# Patient Record
Sex: Male | Born: 1960 | Race: White | Hispanic: No | Marital: Married | Smoking: Never smoker
Health system: Southern US, Community
[De-identification: ages and names within clinical notes are randomized; demographics above are authoritative.]

## PROBLEM LIST (undated history)

## (undated) DIAGNOSIS — I10 Essential (primary) hypertension: Secondary | ICD-10-CM

## (undated) DIAGNOSIS — E785 Hyperlipidemia, unspecified: Secondary | ICD-10-CM

## (undated) DIAGNOSIS — R200 Anesthesia of skin: Secondary | ICD-10-CM

## (undated) DIAGNOSIS — R3915 Urgency of urination: Secondary | ICD-10-CM

## (undated) DIAGNOSIS — R300 Dysuria: Secondary | ICD-10-CM

## (undated) DIAGNOSIS — M25519 Pain in unspecified shoulder: Secondary | ICD-10-CM

## (undated) DIAGNOSIS — M25529 Pain in unspecified elbow: Secondary | ICD-10-CM

## (undated) DIAGNOSIS — R519 Headache, unspecified: Secondary | ICD-10-CM

## (undated) DIAGNOSIS — R351 Nocturia: Secondary | ICD-10-CM

## (undated) DIAGNOSIS — R6882 Decreased libido: Secondary | ICD-10-CM

## (undated) DIAGNOSIS — M1990 Unspecified osteoarthritis, unspecified site: Secondary | ICD-10-CM

## (undated) DIAGNOSIS — K649 Unspecified hemorrhoids: Secondary | ICD-10-CM

## (undated) DIAGNOSIS — H9319 Tinnitus, unspecified ear: Secondary | ICD-10-CM

## (undated) DIAGNOSIS — G4730 Sleep apnea, unspecified: Secondary | ICD-10-CM

## (undated) HISTORY — DX: Hyperlipidemia, unspecified: E78.5

## (undated) HISTORY — DX: Dysuria: R30.0

## (undated) HISTORY — DX: Pain in unspecified elbow: M25.529

## (undated) HISTORY — DX: Unspecified osteoarthritis, unspecified site: M19.90

## (undated) HISTORY — PX: COLONOSCOPY: SHX174

## (undated) HISTORY — DX: Pain in unspecified shoulder: M25.519

## (undated) HISTORY — PX: VASECTOMY: SHX75

## (undated) HISTORY — DX: Sleep apnea, unspecified: G47.30

## (undated) HISTORY — PX: KNEE ARTHROTOMY: SUR107

## (undated) HISTORY — DX: Headache, unspecified: R51.9

## (undated) HISTORY — DX: Unspecified hemorrhoids: K64.9

## (undated) HISTORY — DX: Urgency of urination: R39.15

## (undated) HISTORY — DX: Nocturia: R35.1

## (undated) HISTORY — DX: Decreased libido: R68.82

## (undated) HISTORY — DX: Anesthesia of skin: R20.0

## (undated) HISTORY — DX: Tinnitus, unspecified ear: H93.19

---
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
## 2017-11-17 DIAGNOSIS — J40 Bronchitis, not specified as acute or chronic: Secondary | ICD-10-CM | POA: Diagnosis not present

## 2017-11-17 DIAGNOSIS — J0100 Acute maxillary sinusitis, unspecified: Secondary | ICD-10-CM | POA: Diagnosis not present

## 2018-08-02 DIAGNOSIS — Z0000 Encounter for general adult medical examination without abnormal findings: Secondary | ICD-10-CM | POA: Diagnosis not present

## 2018-08-20 DIAGNOSIS — M7502 Adhesive capsulitis of left shoulder: Secondary | ICD-10-CM | POA: Diagnosis not present

## 2018-08-20 DIAGNOSIS — M19022 Primary osteoarthritis, left elbow: Secondary | ICD-10-CM | POA: Diagnosis not present

## 2018-08-20 DIAGNOSIS — Z0000 Encounter for general adult medical examination without abnormal findings: Secondary | ICD-10-CM | POA: Diagnosis not present

## 2018-08-20 DIAGNOSIS — R7303 Prediabetes: Secondary | ICD-10-CM | POA: Diagnosis not present

## 2018-09-02 DIAGNOSIS — M25712 Osteophyte, left shoulder: Secondary | ICD-10-CM | POA: Diagnosis not present

## 2018-09-02 DIAGNOSIS — M25722 Osteophyte, left elbow: Secondary | ICD-10-CM | POA: Diagnosis not present

## 2018-09-02 DIAGNOSIS — M19012 Primary osteoarthritis, left shoulder: Secondary | ICD-10-CM | POA: Diagnosis not present

## 2018-09-02 DIAGNOSIS — M19022 Primary osteoarthritis, left elbow: Secondary | ICD-10-CM | POA: Diagnosis not present

## 2018-12-05 DIAGNOSIS — J22 Unspecified acute lower respiratory infection: Secondary | ICD-10-CM | POA: Diagnosis not present

## 2019-08-11 DIAGNOSIS — R399 Unspecified symptoms and signs involving the genitourinary system: Secondary | ICD-10-CM | POA: Diagnosis not present

## 2019-08-11 DIAGNOSIS — R030 Elevated blood-pressure reading, without diagnosis of hypertension: Secondary | ICD-10-CM | POA: Diagnosis not present

## 2019-08-11 DIAGNOSIS — Z800 Family history of malignant neoplasm of digestive organs: Secondary | ICD-10-CM | POA: Diagnosis not present

## 2019-08-11 DIAGNOSIS — E663 Overweight: Secondary | ICD-10-CM | POA: Diagnosis not present

## 2019-08-11 DIAGNOSIS — Z1331 Encounter for screening for depression: Secondary | ICD-10-CM | POA: Diagnosis not present

## 2019-08-22 DIAGNOSIS — L821 Other seborrheic keratosis: Secondary | ICD-10-CM | POA: Diagnosis not present

## 2019-08-22 DIAGNOSIS — L570 Actinic keratosis: Secondary | ICD-10-CM | POA: Diagnosis not present

## 2019-08-29 DIAGNOSIS — Z0000 Encounter for general adult medical examination without abnormal findings: Secondary | ICD-10-CM | POA: Diagnosis not present

## 2019-08-29 DIAGNOSIS — R7989 Other specified abnormal findings of blood chemistry: Secondary | ICD-10-CM | POA: Diagnosis not present

## 2019-08-29 DIAGNOSIS — Z125 Encounter for screening for malignant neoplasm of prostate: Secondary | ICD-10-CM | POA: Diagnosis not present

## 2019-08-30 DIAGNOSIS — R82998 Other abnormal findings in urine: Secondary | ICD-10-CM | POA: Diagnosis not present

## 2019-09-01 DIAGNOSIS — Z0000 Encounter for general adult medical examination without abnormal findings: Secondary | ICD-10-CM | POA: Diagnosis not present

## 2019-09-07 DIAGNOSIS — R350 Frequency of micturition: Secondary | ICD-10-CM | POA: Diagnosis not present

## 2019-09-07 DIAGNOSIS — R351 Nocturia: Secondary | ICD-10-CM | POA: Diagnosis not present

## 2019-09-07 DIAGNOSIS — N401 Enlarged prostate with lower urinary tract symptoms: Secondary | ICD-10-CM | POA: Diagnosis not present

## 2019-09-07 DIAGNOSIS — R3912 Poor urinary stream: Secondary | ICD-10-CM | POA: Diagnosis not present

## 2019-09-08 DIAGNOSIS — Q849 Congenital malformation of integument, unspecified: Secondary | ICD-10-CM | POA: Diagnosis not present

## 2019-09-08 DIAGNOSIS — R399 Unspecified symptoms and signs involving the genitourinary system: Secondary | ICD-10-CM | POA: Diagnosis not present

## 2019-09-08 DIAGNOSIS — Z0000 Encounter for general adult medical examination without abnormal findings: Secondary | ICD-10-CM | POA: Diagnosis not present

## 2019-09-08 DIAGNOSIS — R51 Headache: Secondary | ICD-10-CM | POA: Diagnosis not present

## 2019-09-08 DIAGNOSIS — Z800 Family history of malignant neoplasm of digestive organs: Secondary | ICD-10-CM | POA: Diagnosis not present

## 2019-09-14 DIAGNOSIS — Z1212 Encounter for screening for malignant neoplasm of rectum: Secondary | ICD-10-CM | POA: Diagnosis not present

## 2019-10-28 DIAGNOSIS — N401 Enlarged prostate with lower urinary tract symptoms: Secondary | ICD-10-CM | POA: Diagnosis not present

## 2019-10-28 DIAGNOSIS — R351 Nocturia: Secondary | ICD-10-CM | POA: Diagnosis not present

## 2019-10-28 DIAGNOSIS — R3916 Straining to void: Secondary | ICD-10-CM | POA: Diagnosis not present

## 2019-10-28 DIAGNOSIS — R3914 Feeling of incomplete bladder emptying: Secondary | ICD-10-CM | POA: Diagnosis not present

## 2019-11-04 DIAGNOSIS — E7849 Other hyperlipidemia: Secondary | ICD-10-CM | POA: Diagnosis not present

## 2019-11-08 DIAGNOSIS — R399 Unspecified symptoms and signs involving the genitourinary system: Secondary | ICD-10-CM | POA: Diagnosis not present

## 2019-11-08 DIAGNOSIS — R030 Elevated blood-pressure reading, without diagnosis of hypertension: Secondary | ICD-10-CM | POA: Diagnosis not present

## 2019-11-08 DIAGNOSIS — E7849 Other hyperlipidemia: Secondary | ICD-10-CM | POA: Diagnosis not present

## 2019-11-08 DIAGNOSIS — R519 Headache, unspecified: Secondary | ICD-10-CM | POA: Diagnosis not present

## 2019-12-15 DIAGNOSIS — Z23 Encounter for immunization: Secondary | ICD-10-CM | POA: Diagnosis not present

## 2019-12-21 DIAGNOSIS — R3914 Feeling of incomplete bladder emptying: Secondary | ICD-10-CM | POA: Diagnosis not present

## 2019-12-21 DIAGNOSIS — N401 Enlarged prostate with lower urinary tract symptoms: Secondary | ICD-10-CM | POA: Diagnosis not present

## 2019-12-21 DIAGNOSIS — R3912 Poor urinary stream: Secondary | ICD-10-CM | POA: Diagnosis not present

## 2019-12-21 DIAGNOSIS — R351 Nocturia: Secondary | ICD-10-CM | POA: Diagnosis not present

## 2020-01-17 NOTE — Progress Notes (Signed)
[facility]    Provider:  Dr Jaynee Eagles Requesting Provider: Sueanne Margarita, DO Primary Care Provider:  Sueanne Margarita, DO  CC:  Headache  HPI:  Fernando Estrada is a 59 y.o. male here as requested by Sueanne Margarita, DO for headache.  Past medical history of hypertension, ENT problems, arthritis left shoulder and elbow, history of tinnitus, chronic headaches attended acupuncture which was ineffective, hyperlipidemia. A little over a year, 24x7, he wakes up with headaches, he had a sleep apnea test and was mild a few years ago, he feels it off and on all day, it is a 2-3 in pain but its there every day, irritating, he also has tinnitus and hearing problems, headaches are pretty continuous daily, bilaterally symmetric, around the temples, frontal area, lingering, no vision changes, no jaw pain, no neck pain, no muscle tension in the face as far as he knows, he has been to the dentist and eye doctor and all is fine, worse when he wakes in the morning, may be positional worse in the morning. He has dry mouth, he feels fatigued during the day, he wakes twice a night to urinate, he snores but not excessively as far as he knows, no significant alcohol before bed, no FHx of headaches, constant ringing in the ears, no ptosis, no facial weakness, he has some restless legs as well. No gait abnormalities, no sensory changes. No recent imaging of the brain. No light sensitivity, sound sensitivty, nausea, vomiting. ot waking him up.   Reviewed notes, labs and imaging from outside physicians, which showed:  I reviewed Dr.'s Kegel's notes, labs drawn November 04, 2019 include normal CMP with BUN 16 and creatinine 1.1, patient drinks 2 glasses of wine per day 2-4 times a month, never smoker, has a history of elevated blood pressure but never started on medications, patients have headaches, in the morning, for 6 to 8 months, and change in severity, frontal bilateral, tinnitus started 3 to 4 years ago, new  glasses recently, likely tension headache given how much he is on his phone or computer also posture and ergonomics, he is declined an MRI of the brain in the past, started on atenolol.  Review of Systems: Patient complains of symptoms per HPI as well as the following symptoms: headache, fatigue. Pertinent negatives and positives per HPI. All others negative.   Social History   Socioeconomic History  . Marital status: Married    Spouse name: Not on file  . Number of children: 2  . Years of education: Not on file  . Highest education level: Bachelor's degree (e.g., BA, AB, BS)  Occupational History  . Not on file  Tobacco Use  . Smoking status: Never Smoker  . Smokeless tobacco: Never Used  Substance and Sexual Activity  . Alcohol use: Yes    Comment: 1 glass of wine 3-4 nights per month  . Drug use: Never  . Sexual activity: Not on file  Other Topics Concern  . Not on file  Social History Narrative   Lives at home with wife   Caffeine: 1 soda/day   Social Determinants of Health   Financial Resource Strain:   . Difficulty of Paying Living Expenses: Not on file  Food Insecurity:   . Worried About Charity fundraiser in the Last Year: Not on file  . Ran Out of Food in the Last Year: Not on file  Transportation Needs:   . Lack of Transportation (Medical): Not on file  . Lack of Transportation (  Non-Medical): Not on file  Physical Activity:   . Days of Exercise per Week: Not on file  . Minutes of Exercise per Session: Not on file  Stress:   . Feeling of Stress : Not on file  Social Connections:   . Frequency of Communication with Friends and Family: Not on file  . Frequency of Social Gatherings with Friends and Family: Not on file  . Attends Religious Services: Not on file  . Active Member of Clubs or Organizations: Not on file  . Attends Archivist Meetings: Not on file  . Marital Status: Not on file  Intimate Partner Violence:   . Fear of Current or  Ex-Partner: Not on file  . Emotionally Abused: Not on file  . Physically Abused: Not on file  . Sexually Abused: Not on file    Family History  Problem Relation Age of Onset  . Colon cancer Father   . Heart disease Father   . Diabetes Father   . Heart attack Father   . Headache Neg Hx     Past Medical History:  Diagnosis Date  . Arthritis    Left shoulder and elbow   . Dysuria   . Elbow pain    left  . Headache    Mild and consistant   . Hemorrhoid   . Hypertension   . Low libido   . Nocturia   . Numbness    left lower leg  . Shoulder pain    left  . Tinnitus   . Urinary urgency     Patient Active Problem List   Diagnosis Date Noted  . Sleep apnea 01/18/2020    Past Surgical History:  Procedure Laterality Date  . COLONOSCOPY    . KNEE ARTHROTOMY Bilateral    L-1994, R-1996    Current Outpatient Medications  Medication Sig Dispense Refill  . atorvastatin (LIPITOR) 20 MG tablet Take 20 mg by mouth daily.    . tamsulosin (FLOMAX) 0.4 MG CAPS capsule Take 0.4 mg by mouth.     No current facility-administered medications for this visit.    Allergies as of 01/18/2020  . (No Known Allergies)    Vitals: BP (!) 140/99 (BP Location: Right Arm, Patient Position: Sitting)   Pulse 84   Temp (!) 96.6 F (35.9 C) Comment: taken at front  Ht _0  (1.88 m)   Wt 204 lb (92.5 kg)   BMI 26.19 kg/m  Last Weight:  Wt Readings from Last 1 Encounters:  01/18/20 204 lb (92.5 kg)   Last Height:   Ht Readings from Last 1 Encounters:  01/18/20 _1  (1.88 m)     Physical exam: Exam: Gen: NAD, conversant, well nourised, well groomed                     CV: RRR, no MRG. No Carotid Bruits. No peripheral edema, warm, nontender Eyes: Conjunctivae clear without exudates or hemorrhage  Neuro: Detailed Neurologic Exam  Speech:    Speech is normal; fluent and spontaneous with normal comprehension.  Cognition:    The patient is oriented to person, place, and  time;     recent and remote memory intact;     language fluent;     normal attention, concentration,     fund of knowledge Cranial Nerves:    The pupils are equal, round, and reactive to light. The fundi are normal and spontaneous venous pulsations are present. Visual fields are full to finger confrontation.  Extraocular movements are intact. Trigeminal sensation is intact and the muscles of mastication are normal. The face is symmetric. The palate elevates in the midline. Hearing intact. Voice is normal. Shoulder shrug is normal. The tongue has normal motion without fasciculations.   Coordination:    Normal finger to nose and heel to shin. Normal rapid alternating movements.   Gait:    Heel-toe and tandem gait are normal.   Motor Observation:    No asymmetry, no atrophy, and no involuntary movements noted. Tone:    Normal muscle tone.    Posture:    Posture is normal. normal erect    Strength:    Strength is V/V in the upper and lower limbs.      Sensation: intact to LT     Reflex Exam:  DTR's:    Deep tendon reflexes in the upper and lower extremities are normal bilaterally.   Toes:    The toes are downgoing bilaterally.   Clonus:    Clonus is absent.    Assessment/Plan: This is a very nice gentleman with no significant past medical history here for daily morning headaches.  He had a sleep study in 2017 which diagnosed him with sleep apnea, these headaches do not migrainous, they are not hypnic, they do not sound like cluster, I suspect that patient has worsening sleep apnea and we need to get him a sleep test and I will refer him to our sleep team.  However given headache onset in a 59 year old, positional in quality, hearing changes (tinnitus) I do think it would be important to get an MRI of the brain with and without contrast to make sure that there is no space-occupying mass or other intracranial etiology.  Sleep study 10/2016: the sleep study is unavailable in care  everywhere but here is his phone note from results: "I reviewed the sleep study results with him. He will proceed with an APAP setup through [facility]), I faxed an order. He will return for follow-up after using the machine 31-90 days." Patient said he did not get approved for the cpap and that's why he didn't start it.   MRI brain w/wo contrast with thin cuts through [facility] Sleep evaluation for very likely OSA We will check labs today including TSH and BMP.  We will also check ESR and CRP but very low likelihood of temporal arteritis   Orders Placed This Encounter  Procedures  . MR BRAIN W WO CONTRAST  . Basic Metabolic Panel  . C-reactive protein  . Sedimentation rate  . TSH  . Ambulatory referral to Sleep Studies   Discussed: To prevent or relieve headaches, try the following: Cool Compress. Lie down and place a cool compress on your head.  Avoid headache triggers. If certain foods or odors seem to have triggered your migraines in the past, avoid them. A headache diary might help you identify triggers.  Include physical activity in your daily routine. Try a daily walk or other moderate aerobic exercise.  Manage stress. Find healthy ways to cope with the stressors, such as delegating tasks on your to-do list.  Practice relaxation techniques. Try deep breathing, yoga, massage and visualization.  Eat regularly. Eating regularly scheduled meals and maintaining a healthy diet might help prevent headaches. Also, drink plenty of fluids.  Follow a regular sleep schedule. Sleep deprivation might contribute to headaches Consider biofeedback. With this mind-body technique, you learn to control certain bodily functions - such as muscle tension, heart rate and blood pressure -  to prevent headaches or reduce headache pain.    Proceed to emergency room if you experience new or worsening symptoms or symptoms do not resolve, if you have new neurologic symptoms or if headache is severe, or for  any concerning symptom.   Provided education and documentation from American headache Society toolbox including articles on: chronic migraine medication overuse headache, chronic migraines, prevention of migraines, behavioral and other nonpharmacologic treatments for headache.   Cc: Sueanne Margarita, DO  [facility], [facility] [address] [place], [place] [address]  Phone [phone] Fax [phone]

## 2020-01-18 ENCOUNTER — Other Ambulatory Visit: Payer: Self-pay

## 2020-01-18 ENCOUNTER — Encounter: Payer: Self-pay | Admitting: Neurology

## 2020-01-18 ENCOUNTER — Ambulatory Visit: Payer: BC Managed Care – PPO | Admitting: Neurology

## 2020-01-18 VITALS — BP 140/99 | HR 84 | Temp 96.6°F | Ht 74.0 in | Wt 204.0 lb

## 2020-01-18 DIAGNOSIS — H9313 Tinnitus, bilateral: Secondary | ICD-10-CM | POA: Diagnosis not present

## 2020-01-18 DIAGNOSIS — R51 Headache with orthostatic component, not elsewhere classified: Secondary | ICD-10-CM

## 2020-01-18 DIAGNOSIS — R519 Headache, unspecified: Secondary | ICD-10-CM

## 2020-01-18 DIAGNOSIS — R5383 Other fatigue: Secondary | ICD-10-CM

## 2020-01-18 DIAGNOSIS — G4730 Sleep apnea, unspecified: Secondary | ICD-10-CM | POA: Insufficient documentation

## 2020-01-18 NOTE — Patient Instructions (Signed)
Sleep Apnea Sleep apnea is a condition in which breathing pauses or becomes shallow during sleep. Episodes of sleep apnea usually last 10 seconds or longer, and they may occur as many as 20 times an hour. Sleep apnea disrupts your sleep and keeps your body from getting the rest that it needs. This condition can increase your risk of certain health problems, including:  Heart attack.  Stroke.  Obesity.  Diabetes.  Heart failure.  Irregular heartbeat. What are the causes? There are three kinds of sleep apnea:  Obstructive sleep apnea. This kind is caused by a blocked or collapsed airway.  Central sleep apnea. This kind happens when the part of the brain that controls breathing does not send the correct signals to the muscles that control breathing.  Mixed sleep apnea. This is a combination of obstructive and central sleep apnea. The most common cause of this condition is a collapsed or blocked airway. An airway can collapse or become blocked if:  Your throat muscles are abnormally relaxed.  Your tongue and tonsils are larger than normal.  You are overweight.  Your airway is smaller than normal. What increases the risk? You are more likely to develop this condition if you:  Are overweight.  Smoke.  Have a smaller than normal airway.  Are elderly.  Are male.  Drink alcohol.  Take sedatives or tranquilizers.  Have a family history of sleep apnea. What are the signs or symptoms? Symptoms of this condition include:  Trouble staying asleep.  Daytime sleepiness and tiredness.  Irritability.  Loud snoring.  Morning headaches.  Trouble concentrating.  Forgetfulness.  Decreased interest in sex.  Unexplained sleepiness.  Mood swings.  Personality changes.  Feelings of depression.  Waking up often during the night to urinate.  Dry mouth.  Sore throat. How is this diagnosed? This condition may be diagnosed with:  A medical history.  A physical  exam.  A series of tests that are done while you are sleeping (sleep study). These tests are usually done in a sleep lab, but they may also be done at home. How is this treated? Treatment for this condition aims to restore normal breathing and to ease symptoms during sleep. It may involve managing health issues that can affect breathing, such as high blood pressure or obesity. Treatment may include:  Sleeping on your side.  Using a decongestant if you have nasal congestion.  Avoiding the use of depressants, including alcohol, sedatives, and narcotics.  Losing weight if you are overweight.  Making changes to your diet.  Quitting smoking.  Using a device to open your airway while you sleep, such as: ? An oral appliance. This is a custom-made mouthpiece that shifts your lower jaw forward. ? A continuous positive airway pressure (CPAP) device. This device blows air through a mask when you breathe out (exhale). ? A nasal expiratory positive airway pressure (EPAP) device. This device has valves that you put into each nostril. ? A bi-level positive airway pressure (BPAP) device. This device blows air through a mask when you breathe in (inhale) and breathe out (exhale).  Having surgery if other treatments do not work. During surgery, excess tissue is removed to create a wider airway. It is important to get treatment for sleep apnea. Without treatment, this condition can lead to:  High blood pressure.  Coronary artery disease.  In men, an inability to achieve or maintain an erection (impotence).  Reduced thinking abilities. Follow these instructions at home: Lifestyle  Make any lifestyle changes   that your health care provider recommends.  Eat a healthy, well-balanced diet.  Take steps to lose weight if you are overweight.  Avoid using depressants, including alcohol, sedatives, and narcotics.  Do not use any products that contain nicotine or tobacco, such as cigarettes,  e-cigarettes, and chewing tobacco. If you need help quitting, ask your health care provider. General instructions  Take over-the-counter and prescription medicines only as told by your health care provider.  If you were given a device to open your airway while you sleep, use it only as told by your health care provider.  If you are having surgery, make sure to tell your health care provider you have sleep apnea. You may need to bring your device with you.  Keep all follow-up visits as told by your health care provider. This is important. Contact a health care provider if:  The device that you received to open your airway during sleep is uncomfortable or does not seem to be working.  Your symptoms do not improve.  Your symptoms get worse. Get help right away if:  You develop: ? Chest pain. ? Shortness of breath. ? Discomfort in your back, arms, or stomach.  You have: ? Trouble speaking. ? Weakness on one side of your body. ? Drooping in your face. These symptoms may represent a serious problem that is an emergency. Do not wait to see if the symptoms will go away. Get medical help right away. Call your local emergency services (911 in the [place] Do not drive yourself to the hospital. Summary  Sleep apnea is a condition in which breathing pauses or becomes shallow during sleep.  The most common cause is a collapsed or blocked airway.  The goal of treatment is to restore normal breathing and to ease symptoms during sleep. This information is not intended to replace advice given to you by your health care provider. Make sure you discuss any questions you have with your health care provider. Document Revised: 06/01/2019 Document Reviewed: 08/10/2018 Elsevier Patient Education  2020 [employer]  

## 2020-01-19 LAB — BASIC METABOLIC PANEL
BUN/Creatinine Ratio: 13 (ref 10–24)
BUN: 15 mg/dL (ref 8–27)
CO2: 27 mmol/L (ref 20–29)
Calcium: 9.9 mg/dL (ref 8.6–10.2)
Chloride: 99 mmol/L (ref 96–106)
Creatinine, Ser: 1.19 mg/dL (ref 0.76–1.27)
GFR calc Af Amer: 76 mL/min/{1.73_m2} (ref >59)
GFR calc non Af Amer: 66 mL/min/{1.73_m2} (ref >59)
Glucose: 96 mg/dL (ref 65–99)
Potassium: 5.4 mmol/L — ABNORMAL HIGH (ref 3.5–5.2)
Sodium: 136 mmol/L (ref 134–144)

## 2020-01-19 LAB — TSH: TSH: 2 u[IU]/mL (ref 0.450–4.500)

## 2020-01-19 LAB — SEDIMENTATION RATE: Sed Rate: 2 mm/hr (ref 0–30)

## 2020-01-19 LAB — C-REACTIVE PROTEIN: CRP: 1 mg/L (ref 0–10)

## 2020-01-23 ENCOUNTER — Telehealth: Payer: Self-pay | Admitting: *Deleted

## 2020-01-23 NOTE — Telephone Encounter (Signed)
-----   Message from Melvenia Beam, MD sent at 01/19/2020 10:00 AM EST ----- Labs unremarkable thanks

## 2020-01-23 NOTE — Telephone Encounter (Signed)
Spoke with pt and advised his labs are unremarkable, no concerns. He verbalized understanding and appreciation. He will see Dr. Brett Fairy on Thurs 1/28 @ 9 AM.

## 2020-01-26 ENCOUNTER — Ambulatory Visit: Payer: BC Managed Care – PPO | Admitting: Neurology

## 2020-01-26 ENCOUNTER — Encounter: Payer: Self-pay | Admitting: Neurology

## 2020-01-26 ENCOUNTER — Other Ambulatory Visit: Payer: Self-pay

## 2020-01-26 VITALS — BP 136/93 | HR 83 | Temp 97.0°F | Ht 74.0 in | Wt 206.0 lb

## 2020-01-26 DIAGNOSIS — R519 Headache, unspecified: Secondary | ICD-10-CM | POA: Diagnosis not present

## 2020-01-26 DIAGNOSIS — H9313 Tinnitus, bilateral: Secondary | ICD-10-CM | POA: Diagnosis not present

## 2020-01-26 DIAGNOSIS — G478 Other sleep disorders: Secondary | ICD-10-CM

## 2020-01-26 DIAGNOSIS — R0683 Snoring: Secondary | ICD-10-CM

## 2020-01-26 DIAGNOSIS — G4730 Sleep apnea, unspecified: Secondary | ICD-10-CM

## 2020-01-26 DIAGNOSIS — R51 Headache with orthostatic component, not elsewhere classified: Secondary | ICD-10-CM

## 2020-01-26 NOTE — Patient Instructions (Signed)

## 2020-01-26 NOTE — Progress Notes (Signed)
SLEEP MEDICINE CLINIC    Provider:  Larey Seat, MD  Primary Care Physician:  Sueanne Margarita, [place] [address]     Referring Provider: Sueanne Margarita, Do [address] [place],  [place] [address]          Chief Complaint according to patient   Patient presents with:    . New Patient (Initial Visit)           HISTORY OF PRESENT ILLNESS:  Fernando Estrada is a 59 y.o. year old Caucasian male patient seen on 01/26/2020 upon referral from Dr. Jenny Reichmann concern according to patient : " non- restorative sleep " , "Nocturia 2 times a night, and chronic low -grade headaches"     I have the pleasure of seeing Fernando Estrada today, a right -handed White or Caucasian male with a possible sleep disorder.  He   has a past medical history of Arthritis, Dysuria, Elbow pain, Headache, Hemorrhoid, Hypertension, Low libido, Nocturia, Numbness, Shoulder pain, Tinnitus, and Urinary urgency. he follows Dr Jaynee Eagles for headaches, non - migrainous. The patient had the first sleep study in the year 2017  with a result of an AHI ( Apnea Hypopnea index)  of 7.9, and was to my surprise "SPLIT" to allow CPAP being applied , and FFM was initiated. He had no consistent sleep pattern, many arousals after that.  Sleep relevant medical history:  deviated septum , mouth breather.    Family medical /sleep history: No  other family member on CPAP with OSA, insomnia.  Social history:  Patient is working as a Biochemist, clinical  ( usually travelling) for a [employer]- and lives in a household with spouse.  The couple has 2 children. The patient currently works from home office. Pets are not present. Tobacco use; never .  ETOH use ; socially, 3/month Caffeine intake in form of Coffee( none) Soda( yes- 5/week) Tea ( 5/ week),  no energy drinks. Regular exercise in form of gym/ twice weekly.     Sleep habits are as follows: The patient's dinner time is between 6 and 6.30 PM. The patient goes to  bed at 11 PM and  The bedroom is cool, quiet and dark- he continues to sleep for several hours, wakes for several  bathroom breaks, the first time at 2-4 AM.   The preferred sleep position is supine or laterally, with the support of 1 pillow.  Dreams are reportedly rare.  6.45 AM is the usual rise time. The patient wakes up spontaneously 6.30 before his alarm.  He reports not feeling refreshed or restored in AM, with symptoms such as dry mouth in winter, morning headaches because of chronic headaches and he never is woken by headches, and residual fatigue. Naps are taken infrequently, rarely- mostly when jet lagged-  lasting from 15-30 minutes and are as refreshing as nocturnal sleep.    Review of Systems: Out of a complete 14 system review, the patient complains of only the following symptoms, and all other reviewed systems are negative.:  Fatigue, sleepiness , snoring - according to spouse, fragmented sleep,non restorative sleep, nasal congestion, obstruction, mouth breathing.   How likely are you to doze in the following situations: 0 = not likely, 1 = slight chance, 2 = moderate chance, 3 = high chance   Sitting and Reading? Watching Television? Sitting inactive in a public place (theater or meeting)? As a passenger in a car for an hour without a break? Lying down  in the afternoon when circumstances permit? Sitting and talking to someone? Sitting quietly after lunch without alcohol? In a car, while stopped for a few minutes in traffic?   Total = 9/ 24 points   FSS endorsed at 29/ 63 points.   Social History   Socioeconomic History  . Marital status: Married    Spouse name: Not on file  . Number of children: 2  . Years of education: Not on file  . Highest education level: Bachelor's degree (e.g., BA, AB, BS)  Occupational History  . Not on file  Tobacco Use  . Smoking status: Never Smoker  . Smokeless tobacco: Never Used  Substance and Sexual Activity  . Alcohol use: Yes     Comment: 1 glass of wine 3-4 nights per month  . Drug use: Never  . Sexual activity: Not on file  Other Topics Concern  . Not on file  Social History Narrative   Lives at home with wife   Caffeine: 1 soda/day   Social Determinants of Health   Financial Resource Strain:   . Difficulty of Paying Living Expenses: Not on file  Food Insecurity:   . Worried About Charity fundraiser in the Last Year: Not on file  . Ran Out of Food in the Last Year: Not on file  Transportation Needs:   . Lack of Transportation (Medical): Not on file  . Lack of Transportation (Non-Medical): Not on file  Physical Activity:   . Days of Exercise per Week: Not on file  . Minutes of Exercise per Session: Not on file  Stress:   . Feeling of Stress : Not on file  Social Connections:   . Frequency of Communication with Friends and Family: Not on file  . Frequency of Social Gatherings with Friends and Family: Not on file  . Attends Religious Services: Not on file  . Active Member of Clubs or Organizations: Not on file  . Attends Archivist Meetings: Not on file  . Marital Status: Not on file    Family History  Problem Relation Age of Onset  . Colon cancer Father   . Heart disease Father   . Diabetes Father   . Heart attack Father   . Headache Neg Hx     Past Medical History:  Diagnosis Date  . Arthritis    Left shoulder and elbow   . Dysuria   . Elbow pain    left  . Headache    Mild and consistant   . Hemorrhoid   . Hypertension   . Low libido   . Nocturia   . Numbness    left lower leg  . Shoulder pain    left  . Tinnitus   . Urinary urgency     Past Surgical History:  Procedure Laterality Date  . COLONOSCOPY    . KNEE ARTHROTOMY Bilateral    L-1994, R-1996     Current Outpatient Medications on File Prior to Visit  Medication Sig Dispense Refill  . atorvastatin (LIPITOR) 20 MG tablet Take 20 mg by mouth daily.    . tamsulosin (FLOMAX) 0.4 MG CAPS capsule Take 0.4 mg  by mouth.     No current facility-administered medications on file prior to visit.    No Known Allergies  Physical exam:  Today's Vitals   01/26/20 0844  BP: (!) 136/93  Pulse: 83  Temp: (!) 97 F (36.1 C)  Weight: 206 lb (93.4 kg)  Height: 6\' 2"  (1.88 m)  Body mass index is 26.45 kg/m.   Wt Readings from Last 3 Encounters:  01/26/20 206 lb (93.4 kg)  01/18/20 204 lb (92.5 kg)     Ht Readings from Last 3 Encounters:  01/26/20 6\' 2"  (1.88 m)  01/18/20 6\' 2"  (1.88 m)      General: The patient is awake, alert and appears not in acute distress. The patient is well groomed. Head: Normocephalic, atraumatic. Neck is supple.  Mallampati 2,  neck circumference:17 inches . Nasal airflow not patent.   Retrognathia is not  seen. No facial hair  Dental status: inatact Cardiovascular:  Regular rate and cardiac rhythm by pulse,  without distended neck veins. Respiratory: Lungs are clear to auscultation.  Skin:  Without evidence of ankle edema, or rash. Trunk: The patient's posture is erect.   Neurologic exam : The patient is awake and alert, oriented to place and time.   Memory subjective described as intact.  Attention span & concentration ability appears normal.  Speech is fluent,  with nasal dysphonia   Mood and affect are appropriate.   Cranial nerves: no loss of smell or taste reported  Pupils are equal and briskly reactive to light. Funduscopic exam deferred Extraocular movements in vertical and horizontal planes were intact and without nystagmus. No Diplopia. Visual fields by finger perimetry are intact. Hearing was intact to soft voice and finger rubbing.    Facial sensation intact to fine touch. Facial motor strength is symmetric and tongue and uvula move midline.  Neck ROM : rotation, tilt and flexion extension were normal for age and shoulder shrug was symmetrical.    Motor exam:  Symmetric bulk, tone and ROM.   Normal tone without cog- wheeling, symmetric grip  strength .   Sensory:  Fine touch, pinprick and vibration were tested  and  normal.  Proprioception tested in the upper extremities was normal.   Coordination: Rapid alternating movements in the fingers/hands were of normal speed.  The Finger-to-nose maneuver was intact without evidence of ataxia, dysmetria . Mild bilateral hand and finger tremor, essential?    Gait and station: Patient could rise unassisted from a seated position, walked without assistive device.  Stance is of normal width/ base and the patient turned with 3 steps ( RN observation) .  Toe and heel walk were deferred.  Deep tendon reflexes: in the  upper and lower extremities are symmetrically attenuated  and intact.  Babinski response was deferred.  Mr. Koltes has reported a chronic kind of headache that does not change its quality depending on sleep or mealtimes.  He actually can to some degree get distracted from it but it remains blatantly present, always in the background.  There is no associated nausea, photophobia phonophobia.  His headaches do not wake him from sleep nor are they present in a different form when he wakens.  He has no family history of headache he has no history of injury to the facial scalp or traumatic brain injuries.  His blood pressure was a tiny bit elevated when I saw him today and it was also elevated when he saw Dr. Lavell Anchors earlier.  She already checked thyroid hormone metabolic panel also a sedimentation rate and C-reactive protein and ordered an MRI of the brain about a week ago.  These have not yet all returned.  I was able to review his sleep study which happened to my surprise to be a split night protocol study from 11-13-16.  I have to say that I have no  idea why the patient actually was split.  Is back to baseline apnea was under 10, mostly hypopnea there is only one true apnea and 2 hours of sleep, there was no recording that he snored very loudly, all sleeping supine which usually provokes more  apnea.  His Epworth Sleepiness Scale at the time endorsed at only 6 points.  There were low oxygen levels noted but these were nadirs, not sustained and prolonged.  Of hypoxemia there was no bradycardia tachycardia which is a normal physiologic response to hypoxemia.       After spending a total time of 30 minutes face to face and additional time for physical and neurologic examination, review of laboratory studies,  personal review of imaging studies, reports and results of other testing and review of referral information / records as far as provided in visit, I have established the following assessments:  1) This patient has very little risk for OSA, by BMI, by neck size and anatomy.  His non- restorative sleep is chronic , his latent headaches are chronic.  2 years ago his headaches began and soon after he reported tinnitus. Non- pulsatory.   2)  There is a deviated nasal septum, and nasal airflow is restricted / this alone - and supine sleep position  can provoke snoring.   3) there are many other factors to cause non- restorative.  Sed rate , CRP and TSH were all normal.    My Plan is to proceed with:  1) screening for snoring and OSA by HST- no need for an attended sleep study. ENT visit is recomended.     I would like to thank Sueanne Margarita, DO and  Dr Jaynee Eagles  for allowing me to meet with and to take care of this pleasant patient.    I plan to follow up through our NP within 2-3  Month if apnea is found, or another sleep disorder/.    Electronically signed by: Larey Seat, MD 01/26/2020 9:04 AM  [facility] [place] certified by The [employer] of Sleep Medicine and [place] of the [employer] of Sleep Medicine. Board certified In Neurology through the [place], Fellow of the [employer] of Neurology. Medical Director of [employer].

## 2020-02-06 DIAGNOSIS — R350 Frequency of micturition: Secondary | ICD-10-CM | POA: Diagnosis not present

## 2020-02-06 DIAGNOSIS — N401 Enlarged prostate with lower urinary tract symptoms: Secondary | ICD-10-CM | POA: Diagnosis not present

## 2020-02-06 DIAGNOSIS — R3912 Poor urinary stream: Secondary | ICD-10-CM | POA: Diagnosis not present

## 2020-02-13 ENCOUNTER — Other Ambulatory Visit: Payer: Self-pay

## 2020-02-13 ENCOUNTER — Ambulatory Visit (INDEPENDENT_AMBULATORY_CARE_PROVIDER_SITE_OTHER): Payer: BC Managed Care – PPO | Admitting: Neurology

## 2020-02-13 DIAGNOSIS — G478 Other sleep disorders: Secondary | ICD-10-CM

## 2020-02-13 DIAGNOSIS — R51 Headache with orthostatic component, not elsewhere classified: Secondary | ICD-10-CM

## 2020-02-13 DIAGNOSIS — G4733 Obstructive sleep apnea (adult) (pediatric): Secondary | ICD-10-CM | POA: Diagnosis not present

## 2020-02-13 DIAGNOSIS — R519 Headache, unspecified: Secondary | ICD-10-CM

## 2020-02-13 DIAGNOSIS — H9313 Tinnitus, bilateral: Secondary | ICD-10-CM

## 2020-02-13 DIAGNOSIS — R0683 Snoring: Secondary | ICD-10-CM

## 2020-02-13 DIAGNOSIS — G4730 Sleep apnea, unspecified: Secondary | ICD-10-CM

## 2020-02-14 ENCOUNTER — Other Ambulatory Visit: Payer: Self-pay

## 2020-02-14 ENCOUNTER — Ambulatory Visit: Payer: BC Managed Care – PPO

## 2020-02-14 DIAGNOSIS — R5383 Other fatigue: Secondary | ICD-10-CM | POA: Diagnosis not present

## 2020-02-14 DIAGNOSIS — R51 Headache with orthostatic component, not elsewhere classified: Secondary | ICD-10-CM | POA: Diagnosis not present

## 2020-02-14 DIAGNOSIS — R519 Headache, unspecified: Secondary | ICD-10-CM | POA: Diagnosis not present

## 2020-02-14 DIAGNOSIS — H9313 Tinnitus, bilateral: Secondary | ICD-10-CM | POA: Diagnosis not present

## 2020-02-14 MED ORDER — GADOBENATE DIMEGLUMINE 529 MG/ML IV SOLN
20.0000 mL | Freq: Once | INTRAVENOUS | Status: AC | PRN
  Administered 2020-02-14: 10:00:00 20 mL via INTRAVENOUS

## 2020-02-21 DIAGNOSIS — H9313 Tinnitus, bilateral: Secondary | ICD-10-CM | POA: Insufficient documentation

## 2020-02-21 DIAGNOSIS — R0683 Snoring: Secondary | ICD-10-CM | POA: Insufficient documentation

## 2020-02-21 DIAGNOSIS — R519 Headache, unspecified: Secondary | ICD-10-CM | POA: Insufficient documentation

## 2020-02-21 DIAGNOSIS — G478 Other sleep disorders: Secondary | ICD-10-CM | POA: Insufficient documentation

## 2020-02-21 NOTE — Procedures (Signed)
Patient Information     First Name: Fernando Last Name: Vearl Estrada: [id number]  Birth Date: 01/11/1961 Age: 59 Gender: Male  Referring Provider: Jaynee Eagles. MD  BMI: 26.6 (W=207 lb, H=6' 2'')  Neck Circ.:  17 '' Epworth:  9/24   Sleep Study Information    Study Date: Feb 13, 2020 S/H/A Version: 001.001.001.001 / 4.1.1528 / 77  History:    Cavanaugh Butzlaff was seen on 01-26-2020 and has low grade headaches, followed by Dr Jaynee Eagles. He is a patient of Dr Sueanne Margarita, DO .  is a right -handed Caucasian male with a history of Arthritis, Dysuria, Elbow pain, Headache, Hemorrhoid, Hypertension, Low libido, Nocturia, Numbness, Shoulder pain, Tinnitus, and Urinary urgency. The patient had the first sleep study in the year 2017 with a result of an AHI (Apnea Hypopnea Index) of 7.9/h, and was to my surprise "SPLIT" to allow CPAP being applied, and FFM was initiated. He had no consistent sleep pattern after that and discontinued its use. He remains with headaches and nocturia.       Summary & Diagnosis:    This HST indicates the presence of a much more severe degree of sleep apnea at AHI of 31.0/h. During REM sleep, the AHI rose to 46.5/h and there were many snoring related interruptions of sleep , as reflected in the RDI.  During REM sleep hypoxia was noted, but not present for prolonged periods of time.  There was no bradycardia associated.    Recommendations:     This degree of Obstructive Sleep Apnea should be treated with Positive Airway Pressure therapy, and I will order an autotitration capable CPAP device for this patient. This CPAP will be set for 5-18 cm water pressure, 2 cm EPR and heated humidity, and an interface of patient's choice and comfort.   Interpreting Physician: Larey Seat, MD             Sleep Summary  Oxygen Saturation Statistics   Start Study Time: End Study Time: Total Recording Time:       10:44:22 PM 6:43:01 AM 7 h, 58 min  Total Sleep Time % REM of Sleep Time:  7  h, 6 min 26.3    Mean: 92 Minimum: 83 Maximum: 98  Mean of Desaturations Nadirs (%):   90  Oxygen Desaturation. %: 4-9 10-20 >20 Total  Events Number Total  113 100.0  0 0.0  0 0.0  113 100.0  Oxygen Saturation: <90 <=88 <85 <80 <70  Duration (minutes): Sleep % 18.0 6.5 4.2 1.5 0.2 0.1 0.0 0.0 0.0 0.0     Respiratory Indices      Total Events REM NREM All Night  pRDI:  283 pAHI:  219 ODI:  113 pAHIc:  2  % CSR: 0.0 49.2 46.5 31.4 1.1 36.7 25.5 10.5 0.0 40.0 31.0 16.0 0.3       Pulse Rate Statistics during Sleep (BPM)      Mean: 65 Minimum: 52 Maximum: 101    Indices are calculated using technically valid sleep time of  7 h, 4 min. pRDI/pAHI are calculated using oxi desaturations ? 3%  Body Position Statistics  Position Supine Prone Right Left Non-Supine  Sleep (min) 415.0 5.0 2.1 4.0 11.2  Sleep % 97.4 1.2 0.5 0.9 2.6  pRDI 39.6 N/A N/A N/A 53.9  pAHI 30.6 N/A N/A N/A 43.1  ODI 15.5 N/A N/A N/A 32.3     Snoring Statistics Snoring Level (dB) >40 >50 >60 >70 >80 >Threshold (45)  Sleep (min) 360.0 98.7 12.1 2.0 0.0 164.5  Sleep % 84.5 23.2 2.8 0.5 0.0 38.6    Mean: 46 dB

## 2020-02-21 NOTE — Progress Notes (Signed)
Summary & Diagnosis:   This HST indicates the presence of a much more severe degree of  sleep apnea at AHI of 31.0/h.  During REM sleep, the AHI rose to 46.5/h and there were many  snoring related interruptions of sleep , as reflected in the RDI.   During REM sleep hypoxia was noted, but not present for prolonged  periods of time. There was no bradycardia associated.    Recommendations:    This degree of Obstructive Sleep Apnea should be treated with  Positive Airway Pressure therapy, and I will order an  autotitration capable CPAP device for this patient. This CPAP  will be set for 5-18 cm water pressure, 2 cm EPR and heated  humidity, and an interface of patient's choice and comfort.   Interpreting Physician: Larey Seat, MD

## 2020-02-21 NOTE — Addendum Note (Signed)
Addended by: Larey Seat on: 02/21/2020 05:27 PM   Modules accepted: Orders

## 2020-02-22 ENCOUNTER — Telehealth: Payer: Self-pay | Admitting: Neurology

## 2020-02-22 NOTE — Telephone Encounter (Signed)
I called pt. I advised pt that Dr. Brett Fairy reviewed their sleep study results and found that pt has severe sleep apnea. Dr. Brett Fairy recommends that pt starts auto CPAP. I reviewed PAP compliance expectations with the pt. Pt is agreeable to starting a CPAP. I advised pt that an order will be sent to a DME, [facility], and [facility] will call the pt within about one week after they file with the pt's insurance. [facility] will show the pt how to use the machine, fit for masks, and troubleshoot the CPAP if needed. A follow up appt was made for insurance purposes with Ward Givens, NP 04/26/2020 at 10 am. Pt verbalized understanding to arrive 15 minutes early and bring their CPAP. A letter with all of this information in it will be mailed to the pt as a reminder. I verified with the pt that the address we have on file is correct. Pt verbalized understanding of results. Pt had no questions at this time but was encouraged to call back if questions arise. I have sent the order to [facility] and have received confirmation that they have received the order.

## 2020-02-22 NOTE — Telephone Encounter (Signed)
-----   Message from Larey Seat, MD sent at 02/21/2020  5:27 PM EST ----- Summary & Diagnosis:   This HST indicates the presence of a much more severe degree of  sleep apnea at AHI of 31.0/h.  During REM sleep, the AHI rose to 46.5/h and there were many  snoring related interruptions of sleep , as reflected in the RDI.   During REM sleep hypoxia was noted, but not present for prolonged  periods of time. There was no bradycardia associated.    Recommendations:    This degree of Obstructive Sleep Apnea should be treated with  Positive Airway Pressure therapy, and I will order an  autotitration capable CPAP device for this patient. This CPAP  will be set for 5-18 cm water pressure, 2 cm EPR and heated  humidity, and an interface of patient's choice and comfort.   Interpreting Physician: Larey Seat, MD

## 2020-03-13 DIAGNOSIS — N401 Enlarged prostate with lower urinary tract symptoms: Secondary | ICD-10-CM | POA: Diagnosis not present

## 2020-03-13 DIAGNOSIS — R3916 Straining to void: Secondary | ICD-10-CM | POA: Diagnosis not present

## 2020-03-13 DIAGNOSIS — R3914 Feeling of incomplete bladder emptying: Secondary | ICD-10-CM | POA: Diagnosis not present

## 2020-03-16 DIAGNOSIS — G4733 Obstructive sleep apnea (adult) (pediatric): Secondary | ICD-10-CM | POA: Diagnosis not present

## 2020-03-29 DIAGNOSIS — Z23 Encounter for immunization: Secondary | ICD-10-CM | POA: Diagnosis not present

## 2020-04-16 DIAGNOSIS — G4733 Obstructive sleep apnea (adult) (pediatric): Secondary | ICD-10-CM | POA: Diagnosis not present

## 2020-04-26 ENCOUNTER — Other Ambulatory Visit: Payer: Self-pay

## 2020-04-26 ENCOUNTER — Encounter: Payer: Self-pay | Admitting: Adult Health

## 2020-04-26 ENCOUNTER — Ambulatory Visit: Payer: BC Managed Care – PPO | Admitting: Adult Health

## 2020-04-26 VITALS — BP 137/86 | HR 80 | Ht 74.0 in | Wt 209.0 lb

## 2020-04-26 DIAGNOSIS — G4733 Obstructive sleep apnea (adult) (pediatric): Secondary | ICD-10-CM | POA: Diagnosis not present

## 2020-04-26 DIAGNOSIS — Z9989 Dependence on other enabling machines and devices: Secondary | ICD-10-CM

## 2020-04-26 DIAGNOSIS — Z23 Encounter for immunization: Secondary | ICD-10-CM | POA: Diagnosis not present

## 2020-04-26 NOTE — Progress Notes (Signed)
PATIENT: Fernando Estrada DOB: [birth year]  REASON FOR VISIT: follow up HISTORY FROM: patient  HISTORY OF PRESENT ILLNESS: Today 04/26/20:  [name] is a 59 year old male with a history of obstructive sleep apnea on CPAP.  He returns today for follow-up.  His download indicates that he use his machine 26 out of 30 days for compliance of 87%.  She used her machine greater than 4 hours 20 days for compliance of 67%.  On average she uses her machine 4 hours and 53 minutes.  Her residual AHI is 2.9 on 5 to 18 cm of water with EPR 2.  Leak in the 95th percentile is 8.2.  Reports that there are some nights he has trouble falling asleep with it on.  There is also some nights that he takes it off in his sleep without knowing.  HISTORY (Copied from Dr.Dohmeier's note) Fernando Estrada a 59 y.o. year old Caucasian male patientseenon 01/26/2020 upon referral from Dr. AhernChiefconcernaccording to patient : " non- restorative sleep " , "Nocturia 2 times a night, and chronic low -grade headaches"    I have the pleasure of seeing Fernando Estrada today,a right -handed White or Caucasian male with a possible sleep disorder. He   has a past medical history of Arthritis, Dysuria, Elbow pain, Headache, Hemorrhoid, Hypertension, Low libido, Nocturia, Numbness, Shoulder pain, Tinnitus, and Urinary urgency. he follows Dr Jaynee Eagles for headaches, non - migrainous. The patient had the first sleep study in the year 2017  with a result of an AHI ( Apnea Hypopnea index)  of 7.9, and was to my surprise "SPLIT" to allow CPAP being applied , and FFM was initiated. He had no consistent sleep pattern, many arousals after that.  Sleeprelevant medical history:  deviated septum , mouth breather.  Familymedical /sleep history:No  other family member on CPAP with OSA, insomnia. Social history:Patient is working as a Biochemist, clinical  ( usually travelling) for a [employer]- and lives in a household with  spouse.  The couple has 2 children. The patient currently works from home office. Pets are not present. Tobacco use; never . ETOH use ; socially, 3/month Caffeine intake in form of Coffee( none) Soda( yes- 5/week) Tea ( 5/ week),  no energy drinks. Regular exercise in form of gym/ twice weekly.     Sleep habits are as follows:The patient's dinner time is between 6 and 6.30 PM. The patient goes to bed at 11 PM and  The bedroom is cool, quiet and dark- he continues to sleep for several hours, wakes for several  bathroom breaks, the first time at 2-4 AM.   The preferred sleep position is supine or laterally, with the support of 1 pillow.  Dreams are reportedly rare.  6.45 AM is the usual rise time. The patient wakes up spontaneously 6.30 before his alarm.  He reports not feeling refreshed or restored in AM, with symptoms such as dry mouth in winter, morning headaches because of chronic headaches and he never is woken by headches, and residual fatigue. Naps are taken infrequently, rarely- mostly when jet lagged-  lasting from 15-30 minutes and are as refreshing as nocturnal sleep.   REVIEW OF SYSTEMS: Out of a complete 14 system review of symptoms, the patient complains only of the following symptoms, and all other reviewed systems are negative.  FSS 25 ESS 5  ALLERGIES: No Known Allergies  HOME MEDICATIONS: Outpatient Medications Prior to Visit  Medication Sig Dispense Refill  . atorvastatin (LIPITOR) 20  MG tablet Take 20 mg by mouth daily.    . tamsulosin (FLOMAX) 0.4 MG CAPS capsule Take 0.4 mg by mouth.     No facility-administered medications prior to visit.    PAST MEDICAL HISTORY: Past Medical History:  Diagnosis Date  . Arthritis    Left shoulder and elbow   . Dysuria   . Elbow pain    left  . Headache    Mild and consistant   . Hemorrhoid   . Hypertension   . Low libido   . Nocturia   . Numbness    left lower leg  . Shoulder pain    left  . Tinnitus   .  Urinary urgency     PAST SURGICAL HISTORY: Past Surgical History:  Procedure Laterality Date  . COLONOSCOPY    . KNEE ARTHROTOMY Bilateral    L-1994, R-1996    FAMILY HISTORY: Family History  Problem Relation Age of Onset  . Colon cancer Father   . Heart disease Father   . Diabetes Father   . Heart attack Father   . Headache Neg Hx     SOCIAL HISTORY: Social History   Socioeconomic History  . Marital status: Married    Spouse name: Not on file  . Number of children: 2  . Years of education: Not on file  . Highest education level: Bachelor's degree (e.g., BA, AB, BS)  Occupational History  . Not on file  Tobacco Use  . Smoking status: Never Smoker  . Smokeless tobacco: Never Used  Substance and Sexual Activity  . Alcohol use: Yes    Comment: 1 glass of wine 3-4 nights per month  . Drug use: Never  . Sexual activity: Not on file  Other Topics Concern  . Not on file  Social History Narrative   Lives at home with wife   Caffeine: 1 soda/day   Social Determinants of Health   Financial Resource Strain:   . Difficulty of Paying Living Expenses:   Food Insecurity:   . Worried About Charity fundraiser in the Last Year:   . Arboriculturist in the Last Year:   Transportation Needs:   . Film/video editor (Medical):   Marland Kitchen Lack of Transportation (Non-Medical):   Physical Activity:   . Days of Exercise per Week:   . Minutes of Exercise per Session:   Stress:   . Feeling of Stress :   Social Connections:   . Frequency of Communication with Friends and Family:   . Frequency of Social Gatherings with Friends and Family:   . Attends Religious Services:   . Active Member of Clubs or Organizations:   . Attends Archivist Meetings:   Marland Kitchen Marital Status:   Intimate Partner Violence:   . Fear of Current or Ex-Partner:   . Emotionally Abused:   Marland Kitchen Physically Abused:   . Sexually Abused:       PHYSICAL EXAM  Vitals:   04/26/20 0942  BP: 137/86    Pulse: 80  Weight: 209 lb (94.8 kg)  Height: 6\' 2"  (1.88 m)   Body mass index is 26.83 kg/m.  Generalized: Well developed, in no acute distress  Chest: Lungs clear to auscultation bilaterally  Neurological examination  Mentation: Alert oriented to time, place, history taking. Follows all commands speech and language fluent Cranial nerve II-XII: Extraocular movements were full, visual field were full on confrontational test Head turning and shoulder shrug  were normal and symmetric. Motor:  The motor testing reveals 5 over 5 strength of all 4 extremities. Good symmetric motor tone is noted throughout.  Sensory: Sensory testing is intact to soft touch on all 4 extremities. No evidence of extinction is noted.  Gait and station: Gait is normal.    DIAGNOSTIC DATA (LABS, IMAGING, TESTING) - I reviewed patient records, labs, notes, testing and imaging myself where available.      Component Value Date/Time   NA 136 01/18/2020 1003   K 5.4 (H) 01/18/2020 1003   CL 99 01/18/2020 1003   CO2 27 01/18/2020 1003   GLUCOSE 96 01/18/2020 1003   BUN 15 01/18/2020 1003   CREATININE 1.19 01/18/2020 1003   CALCIUM 9.9 01/18/2020 1003   GFRNONAA 66 01/18/2020 1003   GFRAA 76 01/18/2020 1003    Lab Results  Component Value Date   TSH 2.000 01/18/2020      ASSESSMENT AND PLAN 59 y.o. year old male  has a past medical history of Arthritis, Dysuria, Elbow pain, Headache, Hemorrhoid, Hypertension, Low libido, Nocturia, Numbness, Shoulder pain, Tinnitus, and Urinary urgency. here with:  1. OSA on CPAP  - CPAP compliance is suboptimal - Good treatment of AHI  - Encourage patient to use CPAP nightly and > 4 hours each night - F/U in 1 year or sooner if needed   I spent 25 minutes of face-to-face and non-face-to-face time with patient.  This included previsit chart review, lab review, study review, order entry, electronic health record documentation, patient education.  Ward Givens,  MSN, NP-C 04/26/2020, 9:56 AM [facility] [address] [place], [place] [address] [phone]

## 2020-04-26 NOTE — Patient Instructions (Signed)
Continue using CPAP nightly and greater than 4 hours each night °If your symptoms worsen or you develop new symptoms please let us know.  ° °

## 2020-05-16 DIAGNOSIS — G4733 Obstructive sleep apnea (adult) (pediatric): Secondary | ICD-10-CM | POA: Diagnosis not present

## 2020-06-07 DIAGNOSIS — N401 Enlarged prostate with lower urinary tract symptoms: Secondary | ICD-10-CM | POA: Diagnosis not present

## 2020-06-07 DIAGNOSIS — R351 Nocturia: Secondary | ICD-10-CM | POA: Diagnosis not present

## 2020-06-08 DIAGNOSIS — R519 Headache, unspecified: Secondary | ICD-10-CM | POA: Diagnosis not present

## 2020-06-08 DIAGNOSIS — Q849 Congenital malformation of integument, unspecified: Secondary | ICD-10-CM | POA: Diagnosis not present

## 2020-06-08 DIAGNOSIS — R399 Unspecified symptoms and signs involving the genitourinary system: Secondary | ICD-10-CM | POA: Diagnosis not present

## 2020-06-08 DIAGNOSIS — E7849 Other hyperlipidemia: Secondary | ICD-10-CM | POA: Diagnosis not present

## 2020-06-14 DIAGNOSIS — R351 Nocturia: Secondary | ICD-10-CM | POA: Diagnosis not present

## 2020-06-14 DIAGNOSIS — N401 Enlarged prostate with lower urinary tract symptoms: Secondary | ICD-10-CM | POA: Diagnosis not present

## 2020-06-16 DIAGNOSIS — G4733 Obstructive sleep apnea (adult) (pediatric): Secondary | ICD-10-CM | POA: Diagnosis not present

## 2020-07-05 ENCOUNTER — Telehealth: Payer: Self-pay | Admitting: Gastroenterology

## 2020-07-05 NOTE — Telephone Encounter (Signed)
Dr. Lyndel Safe (DOD)  We received a referral for this pt to have a colonoscopy. He had and Endo/colon with [facility].  I have received his records for your review.  Will you accept this pt?

## 2020-07-09 NOTE — Telephone Encounter (Signed)
It is fine by me RG

## 2020-07-10 ENCOUNTER — Encounter: Payer: Self-pay | Admitting: Gastroenterology

## 2020-07-16 DIAGNOSIS — G4733 Obstructive sleep apnea (adult) (pediatric): Secondary | ICD-10-CM | POA: Diagnosis not present

## 2020-08-16 DIAGNOSIS — G4733 Obstructive sleep apnea (adult) (pediatric): Secondary | ICD-10-CM | POA: Diagnosis not present

## 2020-08-17 ENCOUNTER — Ambulatory Visit (AMBULATORY_SURGERY_CENTER): Payer: Self-pay | Admitting: *Deleted

## 2020-08-17 ENCOUNTER — Other Ambulatory Visit: Payer: Self-pay

## 2020-08-17 VITALS — Ht 74.0 in | Wt 207.4 lb

## 2020-08-17 DIAGNOSIS — Z800 Family history of malignant neoplasm of digestive organs: Secondary | ICD-10-CM

## 2020-08-17 MED ORDER — CLENPIQ 10-3.5-12 MG-GM -GM/160ML PO SOLN: 1.0000 | Freq: Once | ORAL | 0 refills | Status: AC

## 2020-08-17 NOTE — Progress Notes (Signed)
Completed covid vaccine greater than 2 weeks ago  Pt is aware that care partner will wait in the car during procedure; if they feel like they will be too hot or cold to wait in the car; they may wait in the 4 th floor lobby. Patient is aware to bring only one care partner. We want them to wear a mask (we do not have any that we can provide them), practice social distancing, and we will check their temperatures when they get here.  I did remind the patient that their care partner needs to stay in the parking lot the entire time and have a cell phone available, we will call them when the pt is ready for discharge. Patient will wear mask into building.   No egg or soy allergy  No home oxygen use   No medications for weight loss taken  Pt denies constipation issues   No trouble with anesthesia, difficulty with intubation or hx/fam hx of malignant hyperthermia per pt  Clenpiq coupon given to pt

## 2020-08-20 ENCOUNTER — Encounter: Payer: Self-pay | Admitting: Gastroenterology

## 2020-08-24 DIAGNOSIS — Z125 Encounter for screening for malignant neoplasm of prostate: Secondary | ICD-10-CM | POA: Diagnosis not present

## 2020-08-24 DIAGNOSIS — Z0000 Encounter for general adult medical examination without abnormal findings: Secondary | ICD-10-CM | POA: Diagnosis not present

## 2020-08-24 DIAGNOSIS — E785 Hyperlipidemia, unspecified: Secondary | ICD-10-CM | POA: Diagnosis not present

## 2020-08-31 ENCOUNTER — Other Ambulatory Visit: Payer: Self-pay

## 2020-08-31 ENCOUNTER — Encounter: Payer: Self-pay | Admitting: Gastroenterology

## 2020-08-31 ENCOUNTER — Ambulatory Visit (AMBULATORY_SURGERY_CENTER): Payer: BC Managed Care – PPO | Admitting: Gastroenterology

## 2020-08-31 VITALS — BP 115/80 | HR 76 | Temp 98.4°F | Resp 14 | Ht 74.0 in | Wt 207.4 lb

## 2020-08-31 DIAGNOSIS — D124 Benign neoplasm of descending colon: Secondary | ICD-10-CM

## 2020-08-31 DIAGNOSIS — Z800 Family history of malignant neoplasm of digestive organs: Secondary | ICD-10-CM | POA: Diagnosis not present

## 2020-08-31 DIAGNOSIS — D123 Benign neoplasm of transverse colon: Secondary | ICD-10-CM

## 2020-08-31 DIAGNOSIS — Z1211 Encounter for screening for malignant neoplasm of colon: Secondary | ICD-10-CM

## 2020-08-31 MED ORDER — SODIUM CHLORIDE 0.9 % IV SOLN: 500.0000 mL | Freq: Once | INTRAVENOUS | Status: DC

## 2020-08-31 NOTE — Op Note (Signed)
[facility] Patient Name: Fernando Estrada Procedure Date: 08/31/2020 1:13 PM MRN: [id number] Endoscopist: Jackquline Denmark , MD Age: 59 Referring MD:  Date of Birth: 24-Jun-1961 Gender: Male Account #: [id number] Procedure:                Colonoscopy Indications:              Screening in patient at increased risk: Colorectal                            cancer in father at age 62. Medicines:                Monitored Anesthesia Care Procedure:                Pre-Anesthesia Assessment:                           - Prior to the procedure, a History and Physical                            was performed, and patient medications and                            allergies were reviewed. The patient's tolerance of                            previous anesthesia was also reviewed. The risks                            and benefits of the procedure and the sedation                            options and risks were discussed with the patient.                            All questions were answered, and informed consent                            was obtained. Prior Anticoagulants: The patient has                            taken no previous anticoagulant or antiplatelet                            agents. ASA Grade Assessment: II - A patient with                            mild systemic disease. After reviewing the risks                            and benefits, the patient was deemed in                            satisfactory condition to undergo the procedure.  After obtaining informed consent, the colonoscope                            was passed under direct vision. Throughout the                            procedure, the patient's blood pressure, pulse, and                            oxygen saturations were monitored continuously. The                            Colonoscope was introduced through the anus and                            advanced to the 2 cm into the ileum.  The                            colonoscopy was performed without difficulty. The                            patient tolerated the procedure well. The quality                            of the bowel preparation was good. The terminal                            ileum, ileocecal valve, appendiceal orifice, and                            rectum were photographed. Scope In: 1:42:05 PM Scope Out: 1:51:04 PM Scope Withdrawal Time: 0 hours 6 minutes 45 seconds  Total Procedure Duration: 0 hours 8 minutes 59 seconds  Findings:                 Two sessile polyps were found in the proximal                            descending colon and distal transverse colon. The                            polyps were 2 to 4 mm in size. These polyps were                            removed with a cold biopsy forceps. Resection and                            retrieval were complete.                           A few rare small-mouthed diverticula were found in                            the sigmoid colon.  Non-bleeding internal hemorrhoids were found during                            retroflexion. The hemorrhoids were small.                           The terminal ileum appeared normal.                           The exam was otherwise without abnormality on                            direct and retroflexion views. Complications:            No immediate complications. Estimated Blood Loss:     Estimated blood loss: none. Impression:               - Small colon polyps s/p polypectomy.                           - Minimal diverticulosis in the sigmoid colon.                           - Non-bleeding internal hemorrhoids.                           - The examined portion of the ileum was normal.                           - The examination was otherwise normal on direct                            and retroflexion views. Recommendation:           - Patient has a contact number available for                             emergencies. The signs and symptoms of potential                            delayed complications were discussed with the                            patient. Return to normal activities tomorrow.                            Written discharge instructions were provided to the                            patient.                           - Resume previous diet.                           - Continue present medications.                           -  Await pathology results.                           - Repeat colonoscopy for surveillance based on                            pathology results.                           - The findings and recommendations were discussed                            with the patient's wife Fernando Estrada. Jackquline Denmark, MD 08/31/2020 1:57:27 PM This report has been signed electronically.

## 2020-08-31 NOTE — Progress Notes (Signed)
Pt's states no medical or surgical changes since previsit or office visit. 

## 2020-08-31 NOTE — Patient Instructions (Signed)
Handouts given:  Polyps, Hemorrhoids, Diverticulosis Resume previous diet continue present medications Await pathology results  YOU HAD AN ENDOSCOPIC PROCEDURE TODAY AT [facility]:   Refer to the procedure report that was given to you for any specific questions about what was found during the examination.  If the procedure report does not answer your questions, please call your gastroenterologist to clarify.  If you requested that your care partner not be given the details of your procedure findings, then the procedure report has been included in a sealed envelope for you to review at your convenience later.  YOU SHOULD EXPECT: Some feelings of bloating in the abdomen. Passage of more gas than usual.  Walking can help get rid of the air that was put into your GI tract during the procedure and reduce the bloating. If you had a lower endoscopy (such as a colonoscopy or flexible sigmoidoscopy) you may notice spotting of blood in your stool or on the toilet paper. If you underwent a bowel prep for your procedure, you may not have a normal bowel movement for a few days.  Please Note:  You might notice some irritation and congestion in your nose or some drainage.  This is from the oxygen used during your procedure.  There is no need for concern and it should clear up in a day or so.  SYMPTOMS TO REPORT IMMEDIATELY:   Following lower endoscopy (colonoscopy or flexible sigmoidoscopy):  Excessive amounts of blood in the stool  Significant tenderness or worsening of abdominal pains  Swelling of the abdomen that is new, acute  Fever of 100F or higher   For urgent or emergent issues, a gastroenterologist can be reached at any hour by calling [phone]. Do not use MyChart messaging for urgent concerns.    DIET:  We do recommend a small meal at first, but then you may proceed to your regular diet.  Drink plenty of fluids but you should avoid alcoholic beverages for 24  hours.  ACTIVITY:  You should plan to take it easy for the rest of today and you should NOT DRIVE or use heavy machinery until tomorrow (because of the sedation medicines used during the test).    FOLLOW UP: Our staff will call the number listed on your records 48-72 hours following your procedure to check on you and address any questions or concerns that you may have regarding the information given to you following your procedure. If we do not reach you, we will leave a message.  We will attempt to reach you two times.  During this call, we will ask if you have developed any symptoms of COVID 19. If you develop any symptoms (ie: fever, flu-like symptoms, shortness of breath, cough etc.) before then, please call [phone].  If you test positive for Covid 19 in the 2 weeks post procedure, please call and report this information to [place].    If any biopsies were taken you will be contacted by phone or by letter within the next 1-3 weeks.  Please call us at [phone] if you have not heard about the biopsies in 3 weeks.    SIGNATURES/CONFIDENTIALITY: You and/or your care partner have signed paperwork which will be entered into your electronic medical record.  These signatures attest to the fact that that the information above on your After Visit Summary has been reviewed and is understood.  Full responsibility of the confidentiality of this discharge information lies with you and/or your care-partner.

## 2020-08-31 NOTE — Progress Notes (Signed)
Called to room to assist during endoscopic procedure.  Patient ID and intended procedure confirmed with present staff. Received instructions for my participation in the procedure from the performing physician.  

## 2020-09-05 ENCOUNTER — Telehealth: Payer: Self-pay | Admitting: *Deleted

## 2020-09-05 NOTE — Telephone Encounter (Signed)
  Follow up Call-  Call back number 08/31/2020  Post procedure Call Back phone  # [phone]  Permission to leave phone message Yes     Patient questions:  Message left to call us if necessary.

## 2020-09-05 NOTE — Telephone Encounter (Signed)
  Follow up Call-  Call back number 08/31/2020  Post procedure Call Back phone  # [phone]  Permission to leave phone message Yes    LMOM to call back with any questions or concerns.  Also, call back if patient has developed fever, respiratory issues or been dx with COVID or had any family members or close contacts diagnosed since her procedure.

## 2020-09-07 ENCOUNTER — Encounter: Payer: Self-pay | Admitting: Gastroenterology

## 2020-09-07 DIAGNOSIS — Z0000 Encounter for general adult medical examination without abnormal findings: Secondary | ICD-10-CM | POA: Diagnosis not present

## 2020-09-16 DIAGNOSIS — G4733 Obstructive sleep apnea (adult) (pediatric): Secondary | ICD-10-CM | POA: Diagnosis not present

## 2020-10-16 DIAGNOSIS — G4733 Obstructive sleep apnea (adult) (pediatric): Secondary | ICD-10-CM | POA: Diagnosis not present

## 2020-10-26 DIAGNOSIS — Z23 Encounter for immunization: Secondary | ICD-10-CM | POA: Diagnosis not present

## 2020-10-31 ENCOUNTER — Other Ambulatory Visit: Payer: Self-pay

## 2020-10-31 ENCOUNTER — Encounter: Payer: Self-pay | Admitting: Adult Health

## 2020-10-31 ENCOUNTER — Ambulatory Visit: Payer: BC Managed Care – PPO | Admitting: Adult Health

## 2020-10-31 VITALS — BP 128/84 | Ht 74.0 in | Wt 214.8 lb

## 2020-10-31 DIAGNOSIS — G4733 Obstructive sleep apnea (adult) (pediatric): Secondary | ICD-10-CM | POA: Diagnosis not present

## 2020-10-31 DIAGNOSIS — Z9989 Dependence on other enabling machines and devices: Secondary | ICD-10-CM

## 2020-10-31 NOTE — Patient Instructions (Signed)

## 2020-10-31 NOTE — Progress Notes (Signed)
PATIENT: Fernando Estrada DOB: 04-Nov-1961  REASON FOR VISIT: follow up HISTORY FROM: patient  HISTORY OF PRESENT ILLNESS: Today 10/31/20:  Fernando Estrada is a 59 year old male with a history of obstructive sleep apnea on CPAP.  His download indicates that he uses machine 15 out of 30 days for compliance of 50%.  He uses machine greater than 4 hours 13 days for compliance of 43%.  On average he uses his machine 5 hours and 35 minutes.  His residual AHI is 1.4 on 5 to 18 cm of water with EPR of 2.  Leak in the 95th percentile is 5.9.  Reports that he still has a hard time adjusting to the CPAP.  Reports that he cannot tell a difference since he started the CPAP.  HISTORY 04/26/20:  Fernando Estrada is a 59 year old male with a history of obstructive sleep apnea on CPAP.  He returns today for follow-up.  His download indicates that he use his machine 26 out of 30 days for compliance of 87%.  She used her machine greater than 4 hours 20 days for compliance of 67%.  On average she uses her machine 4 hours and 53 minutes.  Her residual AHI is 2.9 on 5 to 18 cm of water with EPR 2.  Leak in the 95th percentile is 8.2.  Reports that there are some nights he has trouble falling asleep with it on.  There is also some nights that he takes it off in his sleep without knowing.  REVIEW OF SYSTEMS: Out of a complete 14 system review of symptoms, the patient complains only of the following symptoms, and all other reviewed systems are negative.  FSS 26 ESS 3  ALLERGIES: No Known Allergies  HOME MEDICATIONS: Outpatient Medications Prior to Visit  Medication Sig Dispense Refill  . atorvastatin (LIPITOR) 20 MG tablet Take 20 mg by mouth daily.     No facility-administered medications prior to visit.    PAST MEDICAL HISTORY: Past Medical History:  Diagnosis Date  . Arthritis    Left shoulder and elbow   . Dysuria   . Elbow pain    left  . Headache    Mild and consistant   . Hemorrhoid   .  Hyperlipidemia   . Low libido   . Nocturia   . Numbness    left lower leg  . Shoulder pain    left  . Sleep apnea    uses CPAP PRN  . Tinnitus   . Urinary urgency     PAST SURGICAL HISTORY: Past Surgical History:  Procedure Laterality Date  . COLONOSCOPY    . KNEE ARTHROTOMY Bilateral    L-1994, R-1996  . VASECTOMY      FAMILY HISTORY: Family History  Problem Relation Age of Onset  . Colon cancer Father   . Heart disease Father   . Diabetes Father   . Heart attack Father   . Headache Neg Hx   . Esophageal cancer Neg Hx   . Stomach cancer Neg Hx   . Rectal cancer Neg Hx     SOCIAL HISTORY: Social History   Socioeconomic History  . Marital status: Married    Spouse name: Not on file  . Number of children: 2  . Years of education: Not on file  . Highest education level: Bachelor's degree (e.g., BA, AB, BS)  Occupational History  . Not on file  Tobacco Use  . Smoking status: Never Smoker  . Smokeless tobacco: Never Used  Vaping Use  . Vaping Use: Never used  Substance and Sexual Activity  . Alcohol use: Yes    Comment: 1 glass of wine 3-4 nights per month  . Drug use: Never  . Sexual activity: Not on file  Other Topics Concern  . Not on file  Social History Narrative   Lives at home with wife   Caffeine: 1 soda/day   Social Determinants of Health   Financial Resource Strain:   . Difficulty of Paying Living Expenses: Not on file  Food Insecurity:   . Worried About Charity fundraiser in the Last Year: Not on file  . Ran Out of Food in the Last Year: Not on file  Transportation Needs:   . Lack of Transportation (Medical): Not on file  . Lack of Transportation (Non-Medical): Not on file  Physical Activity:   . Days of Exercise per Week: Not on file  . Minutes of Exercise per Session: Not on file  Stress:   . Feeling of Stress : Not on file  Social Connections:   . Frequency of Communication with Friends and Family: Not on file  . Frequency of  Social Gatherings with Friends and Family: Not on file  . Attends Religious Services: Not on file  . Active Member of Clubs or Organizations: Not on file  . Attends Archivist Meetings: Not on file  . Marital Status: Not on file  Intimate Partner Violence:   . Fear of Current or Ex-Partner: Not on file  . Emotionally Abused: Not on file  . Physically Abused: Not on file  . Sexually Abused: Not on file      PHYSICAL EXAM  Vitals:   10/31/20 1107  BP: 128/84  Weight: 214 lb 12.8 oz (97.4 kg)  Height: 6\' 2"  (1.88 m)   Body mass index is 27.58 kg/m.  Generalized: Well developed, in no acute distress  Chest: Lungs clear to auscultation bilaterally  Neurological examination  Mentation: Alert oriented to time, place, history taking. Follows all commands speech and language fluent Cranial nerve II-XII: Extraocular movements were full, visual field were full on confrontational test Head turning and shoulder shrug  were normal and symmetric. Motor: The motor testing reveals 5 over 5 strength of all 4 extremities. Good symmetric motor tone is noted throughout.  Sensory: Sensory testing is intact to soft touch on all 4 extremities. No evidence of extinction is noted.  Gait and station: Gait is normal.    DIAGNOSTIC DATA (LABS, IMAGING, TESTING) - I reviewed patient records, labs, notes, testing and imaging myself where available.      Component Value Date/Time   NA 136 01/18/2020 1003   K 5.4 (H) 01/18/2020 1003   CL 99 01/18/2020 1003   CO2 27 01/18/2020 1003   GLUCOSE 96 01/18/2020 1003   BUN 15 01/18/2020 1003   CREATININE 1.19 01/18/2020 1003   CALCIUM 9.9 01/18/2020 1003   GFRNONAA 66 01/18/2020 1003   GFRAA 76 01/18/2020 1003   No results found for: CHOL, HDL, LDLCALC, LDLDIRECT, TRIG, CHOLHDL No results found for: HGBA1C No results found for: VITAMINB12 Lab Results  Component Value Date   TSH 2.000 01/18/2020      ASSESSMENT AND PLAN 59 y.o. year  old male  has a past medical history of Arthritis, Dysuria, Elbow pain, Headache, Hemorrhoid, Hyperlipidemia, Low libido, Nocturia, Numbness, Shoulder pain, Sleep apnea, Tinnitus, and Urinary urgency. here with:  1. OSA on CPAP  - CPAP compliance suboptimal - Good  treatment of AHI when using the machine - Encourage patient to use CPAP nightly and > 4 hours each night - F/U in 1 year or sooner if needed   I spent 25 minutes of face-to-face and non-face-to-face time with patient.  This included previsit chart review, lab review, study review, order entry, electronic health record documentation, patient education.  Ward Givens, MSN, NP-C 10/31/2020, 11:15 AM [facility] [address] [place], [place] [address] [phone]

## 2020-11-16 DIAGNOSIS — G4733 Obstructive sleep apnea (adult) (pediatric): Secondary | ICD-10-CM | POA: Diagnosis not present

## 2021-09-06 DIAGNOSIS — Z125 Encounter for screening for malignant neoplasm of prostate: Secondary | ICD-10-CM | POA: Diagnosis not present

## 2021-09-06 DIAGNOSIS — E785 Hyperlipidemia, unspecified: Secondary | ICD-10-CM | POA: Diagnosis not present

## 2021-09-26 DIAGNOSIS — Z1331 Encounter for screening for depression: Secondary | ICD-10-CM | POA: Diagnosis not present

## 2021-09-26 DIAGNOSIS — Z0000 Encounter for general adult medical examination without abnormal findings: Secondary | ICD-10-CM | POA: Diagnosis not present

## 2021-09-26 DIAGNOSIS — Z1339 Encounter for screening examination for other mental health and behavioral disorders: Secondary | ICD-10-CM | POA: Diagnosis not present

## 2021-09-26 DIAGNOSIS — R82998 Other abnormal findings in urine: Secondary | ICD-10-CM | POA: Diagnosis not present

## 2021-09-26 DIAGNOSIS — R030 Elevated blood-pressure reading, without diagnosis of hypertension: Secondary | ICD-10-CM | POA: Diagnosis not present

## 2021-10-10 ENCOUNTER — Other Ambulatory Visit: Payer: Self-pay

## 2021-10-10 ENCOUNTER — Ambulatory Visit: Payer: BC Managed Care – PPO | Admitting: Cardiology

## 2021-10-10 ENCOUNTER — Encounter: Payer: Self-pay | Admitting: Cardiology

## 2021-10-10 VITALS — BP 135/91 | HR 82 | Temp 97.1°F | Resp 16 | Ht 74.0 in | Wt 219.4 lb

## 2021-10-10 DIAGNOSIS — R0609 Other forms of dyspnea: Secondary | ICD-10-CM

## 2021-10-10 DIAGNOSIS — E782 Mixed hyperlipidemia: Secondary | ICD-10-CM

## 2021-10-10 DIAGNOSIS — Z8249 Family history of ischemic heart disease and other diseases of the circulatory system: Secondary | ICD-10-CM | POA: Diagnosis not present

## 2021-10-10 DIAGNOSIS — G4733 Obstructive sleep apnea (adult) (pediatric): Secondary | ICD-10-CM

## 2021-10-10 NOTE — Progress Notes (Signed)
Date:  10/10/2021   ID:  Fernando Estrada, DOB Oct 12, 1961, MRN [id number]  PCP:  Sueanne Margarita, DO  Cardiologist:  Rex Kras, DO, [facility] (established care 10/10/2021)  REASON FOR CONSULT: Family history of ischemic heart disease  REQUESTING PHYSICIAN:  Sueanne Margarita, [place],  [place] [address]  Chief Complaint  Patient presents with   Family history of ischemic heart disease    New Patient (Initial Visit)   Shortness of Breath    HPI  Fernando Estrada is a 60 y.o. male who presents to the office with a chief complaint of " evaluation of heart disease, shortness of breath." Patient's past medical history and cardiovascular risk factors include: Sleep apnea on CPAP, hyperlipidemia, family history of heart disease.   He is referred to the office at the request of Sueanne Margarita, DO for evaluation of heart disease and dyspnea.  Patient is referred to the office for evaluation of heart disease.  Patient's father had 2 myocardial infarctions in his 48s followed by coronary artery bypass grafting surgery.  His brother recently passed away possibly from cardiac source and appeared to be relatively healthy.  Patient has been experiencing shortness of breath predominantly with effort related activities such as walking up a hill for the last 1 year.  The symptoms usually get better with resting.  No associated chest pain during these episodes.  He denies any symptoms of heart failure which include orthopnea, paroxysmal nocturnal dyspnea or lower extremity swelling.  Patient's blood pressure at today's office visit is slightly elevated.  But he does not carry a diagnosis of hypertension.  He does check his blood pressures at home and states that his home blood pressures are usually around 120/80 mmHg.  FUNCTIONAL STATUS: 3-4 times a week patient ambulates about 2 miles regularly.  He walks 1 mile in 15 to 16 minutes.  ALLERGIES: No Known Allergies  MEDICATION LIST PRIOR TO  VISIT: Current Meds  Medication Sig   atorvastatin (LIPITOR) 20 MG tablet Take 20 mg by mouth daily.     PAST MEDICAL HISTORY: Past Medical History:  Diagnosis Date   Arthritis    Left shoulder and elbow    Dysuria    Elbow pain    left   Headache    Mild and consistant    Hemorrhoid    Hyperlipidemia    Low libido    Nocturia    Numbness    left lower leg   Shoulder pain    left   Sleep apnea    uses CPAP PRN   Tinnitus    Urinary urgency     PAST SURGICAL HISTORY: Past Surgical History:  Procedure Laterality Date   COLONOSCOPY     KNEE ARTHROTOMY Bilateral    L-1994, R-1996   VASECTOMY      FAMILY HISTORY: The patient family history includes Anorexia nervosa in his sister; Colon cancer in his father; Diabetes in his father; Heart attack in his father; Heart disease in his father; Heart failure in his brother and sister; Throat cancer in his brother.  SOCIAL HISTORY:  The patient  reports that he has never smoked. He has never used smokeless tobacco. He reports current alcohol use. He reports that he does not use drugs.  REVIEW OF SYSTEMS: Review of Systems  Constitutional: Negative for chills and fever.  HENT:  Negative for hoarse voice and nosebleeds.   Eyes:  Negative for discharge, double vision and pain.  Cardiovascular:  Positive for dyspnea  on exertion. Negative for chest pain, claudication, leg swelling, near-syncope, orthopnea, palpitations, paroxysmal nocturnal dyspnea and syncope.  Respiratory:  Negative for hemoptysis and shortness of breath.   Musculoskeletal:  Negative for muscle cramps and myalgias.  Gastrointestinal:  Negative for abdominal pain, constipation, diarrhea, hematemesis, hematochezia, melena, nausea and vomiting.  Neurological:  Negative for dizziness and light-headedness.   PHYSICAL EXAM: Vitals with BMI 10/10/2021 10/31/2020 08/31/2020  Height 6\' 2"  6\' 2"  -  Weight 219 lbs 6 oz 214 lbs 13 oz -  BMI 69.67 89.38 -  Systolic 101  751 025  Diastolic 91 84 80  Pulse 82 - 76    CONSTITUTIONAL: Well-developed and well-nourished. No acute distress.  SKIN: Skin is warm and dry. No rash noted. No cyanosis. No pallor. No jaundice HEAD: Normocephalic and atraumatic.  EYES: No scleral icterus MOUTH/THROAT: Moist oral membranes.  NECK: No JVD present. No thyromegaly noted. No carotid bruits  LYMPHATIC: No visible cervical adenopathy.  CHEST Normal respiratory effort. No intercostal retractions  LUNGS: Clear to auscultation bilaterally.  No stridor. No wheezes. No rales.  CARDIOVASCULAR: Regular rate and rhythm, positive S1-S2, no murmurs rubs or gallops appreciated ABDOMINAL: Soft, nontender, nondistended, positive bowel sounds in all 4 quadrants no apparent ascites.  EXTREMITIES: No peripheral edema, 2+ DP and PT pulses. HEMATOLOGIC: No significant bruising NEUROLOGIC: Oriented to person, place, and time. Nonfocal. Normal muscle tone.  PSYCHIATRIC: Normal mood and affect. Normal behavior. Cooperative  CARDIAC DATABASE: EKG: 10/10/2021: Normal sinus, 84 bpm, normal axis, without underlying ischemia or injury pattern.  Echocardiogram: No results found for this or any previous visit from the past 1095 days.   Stress Testing: No results found for this or any previous visit from the past 1095 days.  Heart Catheterization: None  LABORATORY DATA: No flowsheet data found.  CMP Latest Ref Rng & Units 01/18/2020  Glucose 65 - 99 mg/dL 96  BUN 8 - 27 mg/dL 15  Creatinine 0.76 - 1.27 mg/dL 1.19  Sodium 134 - 144 mmol/L 136  Potassium 3.5 - 5.2 mmol/L 5.4(H)  Chloride 96 - 106 mmol/L 99  CO2 20 - 29 mmol/L 27  Calcium 8.6 - 10.2 mg/dL 9.9    Lipid Panel  No results found for: CHOL, TRIG, HDL, CHOLHDL, VLDL, LDLCALC, LDLDIRECT, LABVLDL  No components found for: NTPROBNP No results for input(s): PROBNP in the last 8760 hours. No results for input(s): TSH in the last 8760 hours.  BMP No results for input(s): NA,  K, CL, CO2, GLUCOSE, BUN, CREATININE, CALCIUM, GFRNONAA, GFRAA in the last 8760 hours.  HEMOGLOBIN A1C No results found for: HGBA1C, MPG  External Labs: Collected: 09/06/2021 provided by PCP. Sodium 136, potassium 4.5, chloride 102, bicarb 23, BUN 20, creatinine 1.2. AST 16, ALT 11, alkaline phosphatase 69. Hemoglobin 14.7 g/dL, hematocrit 44.7% Total cholesterol 116, triglycerides 65, LDL 60, HDL 43, non-HDL 73. TSH 0.99  IMPRESSION:    ICD-10-CM   1. Dyspnea on exertion  R06.09 PCV ECHOCARDIOGRAM COMPLETE    CT CARDIAC SCORING ([facility] LOCATIONS ONLY)    Basic metabolic panel    Brain natriuretic peptide    2. Family history of ischemic heart disease  Z82.49 EKG 12-Lead    CT CARDIAC SCORING ([facility] LOCATIONS ONLY)    3. Mixed hyperlipidemia  E78.2     4. Obstructive sleep apnea syndrome  G47.33        RECOMMENDATIONS: Fernando Estrada is a 60 y.o. male whose past medical history and cardiac risk factors include: Sleep apnea  on CPAP, hyperlipidemia, family history of heart disease.   Dyspnea on exertion Euvolemic, not in congestive heart failure. EKG nonischemic. Echocardiogram will be ordered to evaluate for structural heart disease and left ventricular systolic function. Coronary calcium score to evaluate for CAC.  Depending on his CAC will order either nuclear stress test versus exercise treadmill stress test.  To be done prior to the next office visit Check BNP and BMP prior to the next office visit  Family history of ischemic heart disease Father had 2 myocardial infarctions followed by CABG in his brother who is 60 years old in fairly good health recently passed away. No family history of premature CAD or sudden cardiac death Given his age, risk factors ischemic work-up ongoing.  Further recommendations to follow  Mixed hyperlipidemia Currently on atorvastatin 20 mg p.o. nightly. Outside labs independently reviewed. Current LDL is less than 70 mg/dL. Does not endorse  myalgias. Currently managed by primary care provider.  As part of this initial consultation reviewed outside records provided by PCP which included office note, labs these findings have been summarized and noted above for further reference.  Discussed disease management, ordering diagnostic testing, coordination of care and patient education provided as a part of today's encounter.   FINAL MEDICATION LIST END OF ENCOUNTER: No orders of the defined types were placed in this encounter.   There are no discontinued medications.   Current Outpatient Medications:    atorvastatin (LIPITOR) 20 MG tablet, Take 20 mg by mouth daily., Disp: , Rfl:   Orders Placed This Encounter  Procedures   CT CARDIAC SCORING ([facility] LOCATIONS ONLY)   Basic metabolic panel   Brain natriuretic peptide   EKG 12-Lead   PCV ECHOCARDIOGRAM COMPLETE    There are no Patient Instructions on file for this visit.   --Continue cardiac medications as reconciled in final medication list. --Return in about 4 weeks (around 11/07/2021) for Follow up, Dyspnea, Review test results. Or sooner if needed. --Continue follow-up with your primary care physician regarding the management of your other chronic comorbid conditions.  Patient's questions and concerns were addressed to his satisfaction. He voices understanding of the instructions provided during this encounter.   This note was created using a voice recognition software as a result there may be grammatical errors inadvertently enclosed that do not reflect the nature of this encounter. Every attempt is made to correct such errors.  Rex Kras, [place], [facility]  Pager: [phone] Office: [phone]

## 2021-10-16 ENCOUNTER — Other Ambulatory Visit: Payer: Self-pay

## 2021-10-16 DIAGNOSIS — R0609 Other forms of dyspnea: Secondary | ICD-10-CM

## 2021-10-18 ENCOUNTER — Other Ambulatory Visit: Payer: Self-pay

## 2021-10-18 ENCOUNTER — Ambulatory Visit: Payer: BC Managed Care – PPO

## 2021-10-18 DIAGNOSIS — R0609 Other forms of dyspnea: Secondary | ICD-10-CM | POA: Diagnosis not present

## 2021-10-31 NOTE — Progress Notes (Addendum)
PATIENT: Fernando Estrada DOB: December 02, 1961  REASON FOR VISIT: follow up HISTORY FROM: patient    HISTORY OF PRESENT ILLNESS: Today 11/04/21 Fernando Estrada is a 60 y.o. male with a history of OSA on CPAP.  His sleep apnea is managed by Dr. Brett Fairy. Initially, Dr. Jaynee Eagles for headaches. He returns today for a follow up. His download below shows poor compliance, He is only wearing is machine for 9 of the last 30 days for a compliance of 30%. He endorses that he experiences tinnitis and low grade headache when he is going to be. This is worsened when he has to wear the CPAP. He states that with his CPAP it takes him over an over to fall asleep, without headgear he falls asleep in minutes. He has tried melatonin several times with the mask, and it hasn't helped with his ability to fall asleep and caused dizziness when he woke up in the middle of the night. Discussed the Inspire device since he in unable to tolerate the CPAP, he does express som interest in this.    HISTORY  10/31/20:   Fernando Estrada is a 60 year old male with a history of obstructive sleep apnea on CPAP.  His download indicates that he uses machine 15 out of 30 days for compliance of 50%.  He uses machine greater than 4 hours 13 days for compliance of 43%.  On average he uses his machine 5 hours and 35 minutes.  His residual AHI is 1.4 on 5 to 18 cm of water with EPR of 2.  Leak in the 95th percentile is 5.9.  Reports that he still has a hard time adjusting to the CPAP.  Reports that he cannot tell a difference since he started the CPAP.  04/26/20:   Fernando Estrada is a 60 year old male with a history of obstructive sleep apnea on CPAP.  He returns today for follow-up.  His download indicates that he use his machine 26 out of 30 days for compliance of 87%.  She used her machine greater than 4 hours 20 days for compliance of 67%.  On average she uses her machine 4 hours and 53 minutes.  Her residual AHI is 2.9 on 5 to 18 cm of water with EPR  2.  Leak in the 95th percentile is 8.2.  Reports that there are some nights he has trouble falling asleep with it on.  There is also some nights that he takes it off in his sleep without knowing. REVIEW OF SYSTEMS: Out of a complete 14 system review of symptoms, the patient complains only of the following symptoms, and all other reviewed systems are negative. ESS 6  ALLERGIES: No Known Allergies  HOME MEDICATIONS: Outpatient Medications Prior to Visit  Medication Sig Dispense Refill   atorvastatin (LIPITOR) 20 MG tablet Take 20 mg by mouth daily.     No facility-administered medications prior to visit.    PAST MEDICAL HISTORY: Past Medical History:  Diagnosis Date   Arthritis    Left shoulder and elbow    Dysuria    Elbow pain    left   Headache    Mild and consistant    Hemorrhoid    Hyperlipidemia    Low libido    Nocturia    Numbness    left lower leg   Shoulder pain    left   Sleep apnea    uses CPAP PRN   Tinnitus    Urinary urgency     PAST SURGICAL HISTORY:  Past Surgical History:  Procedure Laterality Date   COLONOSCOPY     KNEE ARTHROTOMY Bilateral    L-1994, R-1996   VASECTOMY      FAMILY HISTORY: Family History  Problem Relation Age of Onset   Colon cancer Father    Heart disease Father    Diabetes Father    Heart attack Father    Heart failure Sister    Anorexia nervosa Sister    Throat cancer Brother    Heart failure Brother    Headache Neg Hx    Esophageal cancer Neg Hx    Stomach cancer Neg Hx    Rectal cancer Neg Hx    Sleep apnea Neg Hx     SOCIAL HISTORY: Social History   Socioeconomic History   Marital status: Married    Spouse name: Not on file   Number of children: 2   Years of education: Not on file   Highest education level: Bachelor's degree (e.g., BA, AB, BS)  Occupational History   Not on file  Tobacco Use   Smoking status: Never   Smokeless tobacco: Never  Vaping Use   Vaping Use: Never used  Substance and  Sexual Activity   Alcohol use: Yes    Comment: occ   Drug use: Never   Sexual activity: Not on file  Other Topics Concern   Not on file  Social History Narrative   Lives at home with wife   Caffeine: 1 soda/day   Social Determinants of Health   Financial Resource Strain: Not on file  Food Insecurity: Not on file  Transportation Needs: Not on file  Physical Activity: Not on file  Stress: Not on file  Social Connections: Not on file  Intimate Partner Violence: Not on file      PHYSICAL EXAM  Vitals:   11/04/21 1054  BP: (!) 144/95  Pulse: 73  Weight: 98.7 kg  Height: 6\' 2"  (1.88 m)   Body mass index is 27.94 kg/m.  Generalized: Well developed, in no acute distress  Chest: Lungs clear to auscultation bilaterally  Neurological examination  Mentation: Alert oriented to time, place, history taking. Follows all commands speech and language fluent Cranial nerve II-XII: Extraocular movements were full, visual field were full on confrontational test Head turning and shoulder shrug  were normal and symmetric. Motor: The motor testing reveals 5 over 5 strength of all 4 extremities. Good symmetric motor tone is noted throughout.  Sensory: Sensory testing is intact to soft touch on all 4 extremities. No evidence of extinction is noted.  Gait and station: Gait is normal.    DIAGNOSTIC DATA (LABS, IMAGING, TESTING) - I reviewed patient records, labs, notes, testing and imaging myself where available.      Component Value Date/Time   NA 142 11/01/2021 1015   K 5.4 (H) 11/01/2021 1015   CL 103 11/01/2021 1015   CO2 25 11/01/2021 1015   GLUCOSE 98 11/01/2021 1015   BUN 19 11/01/2021 1015   CREATININE 1.15 11/01/2021 1015   CALCIUM 9.7 11/01/2021 1015   GFRNONAA 66 01/18/2020 1003   GFRAA 76 01/18/2020 1003    Lab Results  Component Value Date   TSH 2.000 01/18/2020      ASSESSMENT AND PLAN 60 y.o. year old male  has a past medical history of Arthritis, Dysuria,  Elbow pain, Headache, Hemorrhoid, Hyperlipidemia, Low libido, Nocturia, Numbness, Shoulder pain, Sleep apnea, Tinnitus, and Urinary urgency. here with:  OSA on CPAP  - CPAP compliance sub  optimal - Good treatment of AHI when he wears the machine - Encouraged patient to use CPAP nightly and > 4 hours each night - Discussed Inspire device and provided information. - Discussed dental device - F/U in 1 year or sooner if needed    Ward Givens, MSN, NP-C 11/04/2021, 10:59 AM [facility] [address] [place], [place] [address] [phone]   Will order HST to confirm if Dawna Part is a possible therapeutic option for this patient.  Patient is interested in [facility]. Has BMI of 27. Non tolerant of CPAP, non-compliant. Needs to be checked for hypoxia / central apnea and positional apnea.    Cc Larey Seat, MD

## 2021-11-01 ENCOUNTER — Ambulatory Visit
Admission: RE | Admit: 2021-11-01 | Discharge: 2021-11-01 | Disposition: A | Discharge status: Home / Self Care | Payer: No Typology Code available for payment source | Source: Ambulatory Visit | Attending: Cardiology | Admitting: Cardiology

## 2021-11-01 DIAGNOSIS — E785 Hyperlipidemia, unspecified: Secondary | ICD-10-CM | POA: Diagnosis not present

## 2021-11-01 DIAGNOSIS — Z8249 Family history of ischemic heart disease and other diseases of the circulatory system: Secondary | ICD-10-CM

## 2021-11-01 DIAGNOSIS — R0609 Other forms of dyspnea: Secondary | ICD-10-CM | POA: Diagnosis not present

## 2021-11-02 LAB — BASIC METABOLIC PANEL
BUN/Creatinine Ratio: 17 (ref 10–24)
BUN: 19 mg/dL (ref 8–27)
CO2: 25 mmol/L (ref 20–29)
Calcium: 9.7 mg/dL (ref 8.6–10.2)
Chloride: 103 mmol/L (ref 96–106)
Creatinine, Ser: 1.15 mg/dL (ref 0.76–1.27)
Glucose: 98 mg/dL (ref 70–99)
Potassium: 5.4 mmol/L — ABNORMAL HIGH (ref 3.5–5.2)
Sodium: 142 mmol/L (ref 134–144)
eGFR: 72 mL/min/{1.73_m2} (ref >59)

## 2021-11-02 LAB — BRAIN NATRIURETIC PEPTIDE: BNP: 23.7 pg/mL (ref 0.0–100.0)

## 2021-11-03 ENCOUNTER — Other Ambulatory Visit: Payer: Self-pay | Admitting: Cardiology

## 2021-11-03 DIAGNOSIS — Z8249 Family history of ischemic heart disease and other diseases of the circulatory system: Secondary | ICD-10-CM

## 2021-11-03 DIAGNOSIS — R0609 Other forms of dyspnea: Secondary | ICD-10-CM

## 2021-11-03 DIAGNOSIS — I2510 Atherosclerotic heart disease of native coronary artery without angina pectoris: Secondary | ICD-10-CM

## 2021-11-04 ENCOUNTER — Encounter: Payer: Self-pay | Admitting: Adult Health

## 2021-11-04 ENCOUNTER — Ambulatory Visit: Payer: BC Managed Care – PPO | Admitting: Adult Health

## 2021-11-04 VITALS — BP 144/95 | HR 73 | Ht 74.0 in | Wt 217.6 lb

## 2021-11-04 DIAGNOSIS — Z9989 Dependence on other enabling machines and devices: Secondary | ICD-10-CM | POA: Diagnosis not present

## 2021-11-04 DIAGNOSIS — G4733 Obstructive sleep apnea (adult) (pediatric): Secondary | ICD-10-CM

## 2021-11-04 DIAGNOSIS — H9313 Tinnitus, bilateral: Secondary | ICD-10-CM

## 2021-11-04 DIAGNOSIS — G478 Other sleep disorders: Secondary | ICD-10-CM

## 2021-11-04 DIAGNOSIS — Z789 Other specified health status: Secondary | ICD-10-CM | POA: Diagnosis not present

## 2021-11-04 DIAGNOSIS — R519 Headache, unspecified: Secondary | ICD-10-CM

## 2021-11-04 NOTE — Patient Instructions (Signed)
[email or web address]

## 2021-11-05 ENCOUNTER — Other Ambulatory Visit: Payer: Self-pay

## 2021-11-05 DIAGNOSIS — I2510 Atherosclerotic heart disease of native coronary artery without angina pectoris: Secondary | ICD-10-CM

## 2021-11-05 DIAGNOSIS — E782 Mixed hyperlipidemia: Secondary | ICD-10-CM

## 2021-11-05 DIAGNOSIS — I2584 Coronary atherosclerosis due to calcified coronary lesion: Secondary | ICD-10-CM

## 2021-11-05 NOTE — Progress Notes (Signed)
Called pt no answer left a vm. Can you please reschedule.

## 2021-11-05 NOTE — Progress Notes (Signed)
Pt called back and was informed about his lab results. Transferred pt to the front to due a stress schedule, and appt schedule.

## 2021-11-08 ENCOUNTER — Ambulatory Visit: Payer: BC Managed Care – PPO | Admitting: Cardiology

## 2021-11-08 DIAGNOSIS — Z789 Other specified health status: Secondary | ICD-10-CM | POA: Insufficient documentation

## 2021-11-08 NOTE — Addendum Note (Signed)
Addended by: Larey Seat on: 11/08/2021 03:38 PM   Modules accepted: Orders

## 2021-11-13 ENCOUNTER — Other Ambulatory Visit: Payer: Self-pay

## 2021-11-13 ENCOUNTER — Ambulatory Visit: Payer: BC Managed Care – PPO

## 2021-11-13 DIAGNOSIS — R0609 Other forms of dyspnea: Secondary | ICD-10-CM

## 2021-11-13 DIAGNOSIS — E782 Mixed hyperlipidemia: Secondary | ICD-10-CM | POA: Diagnosis not present

## 2021-11-13 DIAGNOSIS — I2584 Coronary atherosclerosis due to calcified coronary lesion: Secondary | ICD-10-CM | POA: Diagnosis not present

## 2021-11-13 DIAGNOSIS — Z8249 Family history of ischemic heart disease and other diseases of the circulatory system: Secondary | ICD-10-CM

## 2021-11-13 DIAGNOSIS — I2510 Atherosclerotic heart disease of native coronary artery without angina pectoris: Secondary | ICD-10-CM | POA: Diagnosis not present

## 2021-11-14 LAB — BMP8+EGFR
BUN/Creatinine Ratio: 12 (ref 10–24)
BUN: 13 mg/dL (ref 8–27)
CO2: 25 mmol/L (ref 20–29)
Calcium: 9.5 mg/dL (ref 8.6–10.2)
Chloride: 100 mmol/L (ref 96–106)
Creatinine, Ser: 1.13 mg/dL (ref 0.76–1.27)
Glucose: 97 mg/dL (ref 70–99)
Potassium: 5 mmol/L (ref 3.5–5.2)
Sodium: 139 mmol/L (ref 134–144)
eGFR: 73 mL/min/{1.73_m2} (ref >59)

## 2021-11-14 NOTE — Progress Notes (Signed)
Spoke to patient he voiced understanding

## 2021-11-19 NOTE — Progress Notes (Signed)
Called and spoke to pt, pt voiced understanding.

## 2021-11-20 DIAGNOSIS — J0100 Acute maxillary sinusitis, unspecified: Secondary | ICD-10-CM | POA: Diagnosis not present

## 2021-11-20 DIAGNOSIS — R509 Fever, unspecified: Secondary | ICD-10-CM | POA: Diagnosis not present

## 2021-11-25 ENCOUNTER — Other Ambulatory Visit: Payer: Self-pay

## 2021-11-25 ENCOUNTER — Encounter: Payer: Self-pay | Admitting: Cardiology

## 2021-11-25 ENCOUNTER — Ambulatory Visit: Payer: BC Managed Care – PPO | Admitting: Cardiology

## 2021-11-25 VITALS — BP 138/100 | HR 90 | Resp 16 | Ht 74.0 in | Wt 215.2 lb

## 2021-11-25 DIAGNOSIS — I77810 Thoracic aortic ectasia: Secondary | ICD-10-CM | POA: Diagnosis not present

## 2021-11-25 DIAGNOSIS — I10 Essential (primary) hypertension: Secondary | ICD-10-CM | POA: Diagnosis not present

## 2021-11-25 DIAGNOSIS — R0609 Other forms of dyspnea: Secondary | ICD-10-CM

## 2021-11-25 DIAGNOSIS — Z8249 Family history of ischemic heart disease and other diseases of the circulatory system: Secondary | ICD-10-CM

## 2021-11-25 DIAGNOSIS — E782 Mixed hyperlipidemia: Secondary | ICD-10-CM

## 2021-11-25 DIAGNOSIS — G4733 Obstructive sleep apnea (adult) (pediatric): Secondary | ICD-10-CM

## 2021-11-25 DIAGNOSIS — I2584 Coronary atherosclerosis due to calcified coronary lesion: Secondary | ICD-10-CM

## 2021-11-25 DIAGNOSIS — I2510 Atherosclerotic heart disease of native coronary artery without angina pectoris: Secondary | ICD-10-CM | POA: Diagnosis not present

## 2021-11-25 MED ORDER — LOSARTAN POTASSIUM 25 MG PO TABS: 25.0000 mg | ORAL_TABLET | Freq: Every day | ORAL | 0 refills | Status: DC

## 2021-11-25 MED ORDER — ASPIRIN EC 81 MG PO TBEC: 81.0000 mg | DELAYED_RELEASE_TABLET | Freq: Every day | ORAL | 11 refills | Status: DC

## 2021-11-25 MED ORDER — NITROGLYCERIN 0.4 MG SL SUBL: 0.4000 mg | SUBLINGUAL_TABLET | SUBLINGUAL | 0 refills | Status: DC | PRN

## 2021-11-25 MED ORDER — HYDROCHLOROTHIAZIDE 25 MG PO TABS: 25.0000 mg | ORAL_TABLET | Freq: Every morning | ORAL | 0 refills | Status: DC

## 2021-11-25 NOTE — Progress Notes (Signed)
Date:  11/25/2021   ID:  Fernando Estrada, DOB 05-18-1961, MRN [id number]  PCP:  Sueanne Margarita, DO  Cardiologist:  Rex Kras, DO, [facility] (established care 10/10/2021)  Date: 11/25/21 Last Office Visit: 10/10/2021  Chief Complaint  Patient presents with   Shortness of Breath   Results   Follow-up    HPI  Fernando Estrada is a 60 y.o. male who presents to the office with a chief complaint of " evaluation of shortness of breath and discuss test results." Patient's past medical history and cardiovascular risk factors include: Moderate coronary artery calcification (total CAC 111 AU at 65th percentile), mild ascending aorta dilatation (41 mm 10/2021), benign essential hypertension, sleep apnea on CPAP, hyperlipidemia, family history of heart disease.   He is referred to the office at the request of Sueanne Margarita, DO for evaluation of heart disease and dyspnea.  Patient was referred to the office given his history of coronary artery disease in the family (father had 2 myocardial infarctions in his 75s followed by CABG and mother recently passed secondary to possible cardiac etiology).  Clinically he was also experiencing shortness of breath predominantly with effort related activities such as going up a hill for the last 1 year.  At the last office visit he was recommended to undergo echocardiogram and coronary calcium score.  Patient is noted to have moderate coronary artery calcification and subsequently underwent a stress test results reviewed with him and noted below for further reference.  Clinically patient states that he does not have chest pain and his shortness of breath is overall stable.  Morning blood pressures are around 166 mmHg and diastolic blood pressure between 80-90 mmHg.  Evening blood pressures range between 063-016 mmHg and diastolic blood pressures range between 75-85 mmHg.    No hospitalizations or urgent care visits for cardiovascular symptoms since last office  encounter.  FUNCTIONAL STATUS: 3-4 times a week patient ambulates about 2 miles regularly.  He walks 1 mile in 15 to 16 minutes.  ALLERGIES: No Known Allergies  MEDICATION LIST PRIOR TO VISIT: Current Meds  Medication Sig   aspirin EC 81 MG tablet Take 1 tablet (81 mg total) by mouth daily. Swallow whole.   atorvastatin (LIPITOR) 20 MG tablet Take 20 mg by mouth daily.   doxycycline (MONODOX) 100 MG capsule Take 100 mg by mouth 2 (two) times daily.   hydrochlorothiazide (HYDRODIURIL) 25 MG tablet Take 1 tablet (25 mg total) by mouth every morning.   losartan (COZAAR) 25 MG tablet Take 1 tablet (25 mg total) by mouth daily at 10 pm.   nitroGLYCERIN (NITROSTAT) 0.4 MG SL tablet Place 1 tablet (0.4 mg total) under the tongue every 5 (five) minutes as needed for chest pain. If you require more than two tablets five minutes apart go to the nearest ER via EMS.     PAST MEDICAL HISTORY: Past Medical History:  Diagnosis Date   Arthritis    Left shoulder and elbow    Dysuria    Elbow pain    left   Headache    Mild and consistant    Hemorrhoid    Hyperlipidemia    Low libido    Nocturia    Numbness    left lower leg   Shoulder pain    left   Sleep apnea    uses CPAP PRN   Tinnitus    Urinary urgency     PAST SURGICAL HISTORY: Past Surgical History:  Procedure Laterality Date   COLONOSCOPY  KNEE ARTHROTOMY Bilateral    L-1994, R-1996   VASECTOMY      FAMILY HISTORY: The patient family history includes Anorexia nervosa in his sister; Colon cancer in his father; Diabetes in his father; Heart attack in his father; Heart disease in his father; Heart failure in his brother and sister; Throat cancer in his brother.  SOCIAL HISTORY:  The patient  reports that he has never smoked. He has never used smokeless tobacco. He reports current alcohol use. He reports that he does not use drugs.  REVIEW OF SYSTEMS: Review of Systems  Constitutional: Negative for chills and fever.   HENT:  Negative for hoarse voice and nosebleeds.   Eyes:  Negative for discharge, double vision and pain.  Cardiovascular:  Positive for dyspnea on exertion. Negative for chest pain, claudication, leg swelling, near-syncope, orthopnea, palpitations, paroxysmal nocturnal dyspnea and syncope.  Respiratory:  Negative for hemoptysis and shortness of breath.   Musculoskeletal:  Negative for muscle cramps and myalgias.  Gastrointestinal:  Negative for abdominal pain, constipation, diarrhea, hematemesis, hematochezia, melena, nausea and vomiting.  Neurological:  Negative for dizziness and light-headedness.   PHYSICAL EXAM: Vitals with BMI 11/25/2021 11/25/2021 11/04/2021  Height - 6\' 2"  6\' 2"   Weight - 215 lbs 3 oz 217 lbs 10 oz  BMI - 57.32 20.25  Systolic 427 062 376  Diastolic 283 97 95  Pulse 90 86 73    CONSTITUTIONAL: Well-developed and well-nourished. No acute distress.  SKIN: Skin is warm and dry. No rash noted. No cyanosis. No pallor. No jaundice HEAD: Normocephalic and atraumatic.  EYES: No scleral icterus MOUTH/THROAT: Moist oral membranes.  NECK: No JVD present. No thyromegaly noted. No carotid bruits  LYMPHATIC: No visible cervical adenopathy.  CHEST Normal respiratory effort. No intercostal retractions  LUNGS: Clear to auscultation bilaterally.  No stridor. No wheezes. No rales.  CARDIOVASCULAR: Regular rate and rhythm, positive S1-S2, no murmurs rubs or gallops appreciated ABDOMINAL: Soft, nontender, nondistended, positive bowel sounds in all 4 quadrants no apparent ascites.  EXTREMITIES: No peripheral edema, 2+ DP and PT pulses. HEMATOLOGIC: No significant bruising NEUROLOGIC: Oriented to person, place, and time. Nonfocal. Normal muscle tone.  PSYCHIATRIC: Normal mood and affect. Normal behavior. Cooperative  CARDIAC DATABASE: EKG: 10/10/2021: Normal sinus, 84 bpm, normal axis, without underlying ischemia or injury pattern.   Echocardiogram: 10/18/2021: Normal LV  systolic function with visual EF 60-65%. Left ventricle cavity is normal in size. Mild to moderate left ventricular hypertrophy. Normal global wall motion. Normal diastolic filling pattern, normal LAP. Mild (Grade I) mitral regurgitation. Mild tricuspid regurgitation. No evidence of pulmonary hypertension. The aortic root is mildly dilated (Sinotubular junction 34mm). No prior study for comparison.   Stress Testing: Exercise nuclear stress test 11/12/2021:  Normal ECG stress. The patient exercised for 6 minutes and 0 seconds of a Bruce protocol, achieving approximately 7.05 METs. Patient was hypertensive but at rest and with stress with blood pressure 148/108 mmHg with peak 160/102 mmHg.  Myocardial perfusion is abnormal. There is a small to medium reversible mild defect in the lateral, inferior and apical regions.  Overall LV systolic function is normal without regional wall motion abnormalities. Stress LV EF: 57%.  No previous exam available for comparison. Low risk study.    Coronary calcium score 11/01/2021: 1. Coronary calcium score of 111 is at the 65th percentile for the patient's age, sex and race. 2. Mild aneurysmal dilatation of the ascending thoracic aorta measuring up to 4.1 cm. Recommend annual imaging followup by CTA or  MRA. This recommendation follows 2010 ACCF/AHA/AATS/ACR/ASA/SCA/SCAI/SIR/STS/SVM Guidelines for the Diagnosis and Management of Patients with Thoracic Aortic Disease. Circulation. 2010; 121: L845-X646. Aortic aneurysm NOS (ICD10-I71.9) 3. Bilateral gynecomastia.  LABORATORY DATA: No flowsheet data found.  CMP Latest Ref Rng & Units 11/13/2021 11/01/2021 01/18/2020  Glucose 70 - 99 mg/dL 97 98 96  BUN 8 - 27 mg/dL 13 19 15   Creatinine 0.76 - 1.27 mg/dL 1.13 1.15 1.19  Sodium 134 - 144 mmol/L 139 142 136  Potassium 3.5 - 5.2 mmol/L 5.0 5.4(H) 5.4(H)  Chloride 96 - 106 mmol/L 100 103 99  CO2 20 - 29 mmol/L 25 25 27   Calcium 8.6 - 10.2 mg/dL 9.5 9.7 9.9     Lipid Panel  No results found for: CHOL, TRIG, HDL, CHOLHDL, VLDL, LDLCALC, LDLDIRECT, LABVLDL  No components found for: NTPROBNP No results for input(s): PROBNP in the last 8760 hours. No results for input(s): TSH in the last 8760 hours.  BMP Recent Labs    11/01/21 1015 11/13/21 0956  NA 142 139  K 5.4* 5.0  CL 103 100  CO2 25 25  GLUCOSE 98 97  BUN 19 13  CREATININE 1.15 1.13  CALCIUM 9.7 9.5    HEMOGLOBIN A1C No results found for: HGBA1C, MPG  External Labs: Collected: 09/06/2021 provided by PCP. Sodium 136, potassium 4.5, chloride 102, bicarb 23, BUN 20, creatinine 1.2. AST 16, ALT 11, alkaline phosphatase 69. Hemoglobin 14.7 g/dL, hematocrit 44.7% Total cholesterol 116, triglycerides 65, LDL 60, HDL 43, non-HDL 73. TSH 0.99  IMPRESSION:    ICD-10-CM   1. Coronary atherosclerosis due to calcified coronary lesion  I25.10 aspirin EC 81 MG tablet   I25.84 nitroGLYCERIN (NITROSTAT) 0.4 MG SL tablet    2. Dyspnea on exertion  R06.09 hydrochlorothiazide (HYDRODIURIL) 25 MG tablet    losartan (COZAAR) 25 MG tablet    Basic metabolic panel    Magnesium    3. Mixed hyperlipidemia  E78.2     4. Family history of ischemic heart disease  Z82.49     5. Obstructive sleep apnea syndrome  G47.33     6. Benign hypertension  I10 hydrochlorothiazide (HYDRODIURIL) 25 MG tablet    losartan (COZAAR) 25 MG tablet    Basic metabolic panel    Magnesium       RECOMMENDATIONS: Fernando Estrada is a 60 y.o. male whose past medical history and cardiac risk factors include: Moderate coronary artery calcification (total CAC 111 AU at 65th percentile), mild ascending aorta dilatation (41 mm 10/2021), benign essential hypertension, sleep apnea on CPAP, hyperlipidemia, family history of heart disease.   Coronary atherosclerosis due to calcified coronary lesion Total CAC 111AU, 65th percentile Continue statin therapy. Start aspirin 81 mg p.o. daily Echo: LVEF 60 to 65%, mild to  moderate LVH, mild MR, mild TR, aortic root mildly dilated at 40 mm.  MPI: Small to medium reversible mild perfusion defect in the lateral, inferior and apical regions.  LVEF 57%, low risk study.  Reviewed the images of the myocardial perfusion study with the patient in great detail at today's office visit.  He does have a subtle perfusion defect without angina pectoris.  We discussed undergoing either coronary CTA or left heart catheterization for further evaluation.  However, the shared decision was to improve his modifiable cardiovascular risk factors such as hypertension and reevaluate.  In the interim, if he develops new onset of chest pain or worsening shortness of breath he is asked to go to the closest ER via EMS  for further evaluation and management.  He verbalizes understanding and provides feedback. Currently not on medications for erectile dysfunction.  Sublingual nitroglycerin tablets to use on a as needed basis if needed for anginal equivalents.  If he requires more than 2 tablets 5 minutes apart he is asked to go to the closest ER via EMS for further evaluation.  Dyspnea on exertion Chronic and stable Suspect is most likely due to untreated hypertension. Given his blood pressure readings at home, LVH on echo, and mild dilatation of the ascending aorta that shared decision was to initiate antihypertensive medications. Overall euvolemic and not in congestive heart failure. Echocardiogram notes preserved LVEF and normal diastolic function as well.  Benign hypertension Home blood pressures are now well controlled, blood pressure log reviewed findings noted above Office blood pressures are also not well controlled. Hydrochlorothiazide 25 mg p.o. every morning. Losartan 25 mg p.o. every afternoon Blood work in 1 week to evaluate kidney function and electrolytes. Patient has a known history of hyperkalemia which is probably due to consuming foods high in potassium intake.  This has resolved  as of labs dating 11/13/2021.  Ascending aorta dilatation: Noted on calcium scoring study 11//2022 Ascending aorta measuring 41 mm Initiated blood pressure management as discussed above Will need follow-up in 1 year -will be arranged at the next visit  Mixed hyperlipidemia Currently on atorvastatin.   He denies myalgia or other side effects. Most recent lipids dated 09/06/2021 reviewed as noted above.  LDL at goal Currently managed by primary care provider.  Obstructive sleep apnea syndrome Educated on importance of CPAP compliance.    FINAL MEDICATION LIST END OF ENCOUNTER: Meds ordered this encounter  Medications   aspirin EC 81 MG tablet    Sig: Take 1 tablet (81 mg total) by mouth daily. Swallow whole.    Dispense:  30 tablet    Refill:  11   hydrochlorothiazide (HYDRODIURIL) 25 MG tablet    Sig: Take 1 tablet (25 mg total) by mouth every morning.    Dispense:  90 tablet    Refill:  0   losartan (COZAAR) 25 MG tablet    Sig: Take 1 tablet (25 mg total) by mouth daily at 10 pm.    Dispense:  90 tablet    Refill:  0   nitroGLYCERIN (NITROSTAT) 0.4 MG SL tablet    Sig: Place 1 tablet (0.4 mg total) under the tongue every 5 (five) minutes as needed for chest pain. If you require more than two tablets five minutes apart go to the nearest ER via EMS.    Dispense:  30 tablet    Refill:  0     There are no discontinued medications.   Current Outpatient Medications:    aspirin EC 81 MG tablet, Take 1 tablet (81 mg total) by mouth daily. Swallow whole., Disp: 30 tablet, Rfl: 11   atorvastatin (LIPITOR) 20 MG tablet, Take 20 mg by mouth daily., Disp: , Rfl:    doxycycline (MONODOX) 100 MG capsule, Take 100 mg by mouth 2 (two) times daily., Disp: , Rfl:    hydrochlorothiazide (HYDRODIURIL) 25 MG tablet, Take 1 tablet (25 mg total) by mouth every morning., Disp: 90 tablet, Rfl: 0   losartan (COZAAR) 25 MG tablet, Take 1 tablet (25 mg total) by mouth daily at 10 pm., Disp: 90  tablet, Rfl: 0   nitroGLYCERIN (NITROSTAT) 0.4 MG SL tablet, Place 1 tablet (0.4 mg total) under the tongue every 5 (five) minutes as needed for  chest pain. If you require more than two tablets five minutes apart go to the nearest ER via EMS., Disp: 30 tablet, Rfl: 0  Orders Placed This Encounter  Procedures   Basic metabolic panel   Magnesium    There are no Patient Instructions on file for this visit.   --Continue cardiac medications as reconciled in final medication list. --Return in about 6 weeks (around 01/06/2022) for Follow up, Dyspnea. Or sooner if needed. --Continue follow-up with your primary care physician regarding the management of your other chronic comorbid conditions.  Patient's questions and concerns were addressed to his satisfaction. He voices understanding of the instructions provided during this encounter.   This note was created using a voice recognition software as a result there may be grammatical errors inadvertently enclosed that do not reflect the nature of this encounter. Every attempt is made to correct such errors.  Rex Kras, [place], [facility]  Pager: [phone] Office: [phone]

## 2021-11-29 ENCOUNTER — Other Ambulatory Visit: Payer: Self-pay | Admitting: Cardiology

## 2021-11-29 DIAGNOSIS — I2510 Atherosclerotic heart disease of native coronary artery without angina pectoris: Secondary | ICD-10-CM

## 2021-12-03 ENCOUNTER — Other Ambulatory Visit: Payer: Self-pay | Admitting: Cardiology

## 2021-12-03 ENCOUNTER — Other Ambulatory Visit: Payer: Self-pay

## 2021-12-03 DIAGNOSIS — R0609 Other forms of dyspnea: Secondary | ICD-10-CM

## 2021-12-03 DIAGNOSIS — I2510 Atherosclerotic heart disease of native coronary artery without angina pectoris: Secondary | ICD-10-CM

## 2021-12-03 DIAGNOSIS — I10 Essential (primary) hypertension: Secondary | ICD-10-CM

## 2021-12-03 DIAGNOSIS — I2584 Coronary atherosclerosis due to calcified coronary lesion: Secondary | ICD-10-CM

## 2021-12-04 ENCOUNTER — Encounter: Payer: Self-pay | Admitting: Adult Health

## 2021-12-04 DIAGNOSIS — G4733 Obstructive sleep apnea (adult) (pediatric): Secondary | ICD-10-CM

## 2021-12-05 ENCOUNTER — Telehealth: Payer: Self-pay | Admitting: Neurology

## 2021-12-05 NOTE — Telephone Encounter (Signed)
  Hello Mr Krist. I have seen you upon referral from Dr Percell Belt here at [facility] we have seen you for a Home Sleep Test:  This was the essence of your HST result from 01-2020: This HST indicates the presence of a severe degree of sleep apnea at AHI of 31.0/h. During REM sleep, the AHI rose to 46.5/h and there were many snoring related interruptions of sleep , as reflected in the RDI.  During REM sleep hypoxia was noted, but not present for prolonged periods of time.  There was no bradycardia associated.      Recommendations:       This degree of Obstructive Sleep Apnea should be treated with Positive Airway Pressure therapy, and I will order an autotitration capable CPAP device for this patient. This CPAP will be set for 5-18 cm water pressure, 2 cm EPR and heated humidity, and an interface of patient's choice and comfort.     My thoughts: REM AHI was almost double the AHI in NREM sleep. REM sleep apnea is not as responsive to inspire device or dental device.  You numbers on CPAP look good, excellent apnea control, but you struggle with compliance. You don't have central apneas and you are not hypoxic in sleep, that's a plus if you are interested in an Inspire device,an implanted nerve stimulator.   Would you feel happier with a reduction of apnea by 50-60% , having mild apnea remaining? ( Dr. Francesco Sor)  Would it be worth it even if this therapy was not sufficient to keep you from relapsing into atrial fibrillation?  ( Dr.Tolia).  If your answer is yes to both accounts , I will be happy to send you to Dr Redmond Baseman and Wilburn Cornelia, ENT, for the endoscopy of your a upper airway.   Let us know how we can direct you further - Larey Seat, MD

## 2021-12-05 NOTE — Telephone Encounter (Signed)
See my phone note, please- routed to Dr Jaynee Eagles, Dr Terri Skains and Dr Francesco Sor.  I laid out what can be expected and what is not likely to happen with inspire - and his risks with Inspire.  Larey Seat, MD

## 2021-12-05 NOTE — Telephone Encounter (Signed)
Dr Brett Fairy, Would you be willing to refer to ENT now or do you need to see him first to discuss Inspire? He recently saw [place]. His BMI is 27.6  [place]

## 2021-12-06 DIAGNOSIS — R0609 Other forms of dyspnea: Secondary | ICD-10-CM | POA: Diagnosis not present

## 2021-12-06 DIAGNOSIS — I10 Essential (primary) hypertension: Secondary | ICD-10-CM | POA: Diagnosis not present

## 2021-12-07 LAB — BASIC METABOLIC PANEL
BUN/Creatinine Ratio: 13 (ref 10–24)
BUN: 15 mg/dL (ref 8–27)
CO2: 27 mmol/L (ref 20–29)
Calcium: 10 mg/dL (ref 8.6–10.2)
Chloride: 96 mmol/L (ref 96–106)
Creatinine, Ser: 1.15 mg/dL (ref 0.76–1.27)
Glucose: 108 mg/dL — ABNORMAL HIGH (ref 70–99)
Potassium: 4.5 mmol/L (ref 3.5–5.2)
Sodium: 138 mmol/L (ref 134–144)
eGFR: 72 mL/min/{1.73_m2} (ref >59)

## 2021-12-07 LAB — BRAIN NATRIURETIC PEPTIDE: BNP: 14.7 pg/mL (ref 0.0–100.0)

## 2021-12-07 LAB — MAGNESIUM: Magnesium: 2.2 mg/dL (ref 1.6–2.3)

## 2021-12-09 ENCOUNTER — Encounter: Payer: Self-pay | Admitting: Neurology

## 2021-12-09 DIAGNOSIS — B9689 Other specified bacterial agents as the cause of diseases classified elsewhere: Secondary | ICD-10-CM | POA: Diagnosis not present

## 2021-12-09 DIAGNOSIS — J019 Acute sinusitis, unspecified: Secondary | ICD-10-CM | POA: Diagnosis not present

## 2021-12-10 NOTE — Progress Notes (Signed)
Patient is aware of results.

## 2021-12-10 NOTE — Progress Notes (Signed)
Called pt no answer, left vm  

## 2021-12-12 NOTE — Telephone Encounter (Signed)
I spoke with the sleep lab manager and I spoke with the patient.  Per Dr. Brett Fairy, she does not need to see the patient at this time but instead I let the patient know I would be sending a message from Dr. Brett Fairy to the pt's mychart which lays out her concerns and what can be expected with the inspire.  Patient understands per Dr. Brett Fairy that he may have 48 to 60% reduction in AHI with inspire and there is a risk of relapse into atrial fibrillation, whereas he has excellent control of his AHI when he uses his CPAP.  Patient mentioned concerns about his experience with his DME company, [employer], and also with his mask exacerbating/affecting his tinnitus and headaches.  The patient asked about switching DME companies to see if he could tolerate CPAP better but he will also discuss with his primary care and cardiology what their thoughts are on the Inspire in relation to Dr. Edwena Felty.  I asked him to give [place] a call after he sees cardiology in January.  We will start the process of switching his DME company.  Patient stated that as far as he knows the machine is his . The patient's questions were answered and he was very appreciative of the call.  Sleep lab manager is in contact with [employer] for an anticipated switch. Message sent to patient from Dr Brett Fairy. The upcoming appt with Dr Brett Fairy has been canceled for now.

## 2021-12-19 ENCOUNTER — Ambulatory Visit: Payer: Self-pay | Admitting: Neurology

## 2021-12-19 NOTE — Telephone Encounter (Signed)
The latest update I have received from our sleep manager is that pt's current DME is reaching out to him. He has an outstanding bill and this would have to be settled before he can switch DME companies.

## 2021-12-25 NOTE — Telephone Encounter (Signed)
Per sleep manager, we have been made aware the billing issues were resolved with [employer]. Will check with pt to see if he is amenable to trying the cpap again with mask refit and if he still wants to change DME provider vs proceeding with sleep study for inspire?

## 2021-12-25 NOTE — Telephone Encounter (Signed)
Transfer of care checklist received, printed all requested information, order placed for transfer of care, mask refit, supplies faxed to [employer] attn Tammy. Received a receipt of confirmation.

## 2021-12-25 NOTE — Telephone Encounter (Signed)
I spoke with Tammy @ [facility]. Will begin process of DME switch request. She will send a form to [place] to complete.

## 2021-12-25 NOTE — Addendum Note (Signed)
Addended by: Gildardo Griffes on: 12/25/2021 05:28 PM   Modules accepted: Orders

## 2022-01-01 NOTE — Telephone Encounter (Signed)
Thank-you for the update.   Fernando Estrada

## 2022-01-01 NOTE — Telephone Encounter (Signed)
This Patient is interested in [place] device:  The patient carried a diagnosis of MILD OSA before retesting 01-2020:   This last sleep test, a HST , indicates the presence of a  more severe degree of  sleep apnea at AHI of 31.0/h.  During REM sleep, the AHI rose to 46.5/h and there were many  snoring related interruptions of sleep , as reflected in the RDI.  High proportion of REM sleep was noted, and sleep was not very fragmented.  During REM sleep, hypoxia was noted, but not present for prolonged  periods of time. There was no bradycardia associated. Loud snoring was present - mean Volume was 46 dB.  The patient did not have a significant relief from apnea by any sleep position( Supine AHI was 30.6 and non supine 43.1/h)      Recommendations were as follows:      This degree of Obstructive Sleep Apnea should be treated with  Positive Airway Pressure therapy, and I will order an  autotitration capable CPAP device for this patient. This CPAP  will be set for 5-18 cm water pressure, 2 cm EPR and heated  humidity, and an interface of patient's choice and comfort.  CD   The AHI is not an exclusion criterion form INSPIRE use, but Inspire does not treat REM dependent apneas well. Inspire can possibly reducing the AHI to a lower severity of apnea, but will not increase oxygen saturation. Please let's discuss this with Dr. Terri Skains , Dr. Francesco Sor and Dr Jaynee Eagles, or meet directly with me.  Sincerely, Larey Seat, MD

## 2022-01-03 NOTE — Telephone Encounter (Signed)
Thank-you for the follow up .   He an appt with him I will take sometime to discuss the cardiovascular benefits of treating his sleep apnea.   Will direct him your way if he agrees to move forward.   Dr. Terri Skains

## 2022-01-06 ENCOUNTER — Encounter: Payer: Self-pay | Admitting: Cardiology

## 2022-01-06 ENCOUNTER — Other Ambulatory Visit: Payer: Self-pay

## 2022-01-06 ENCOUNTER — Ambulatory Visit: Payer: BC Managed Care – PPO | Admitting: Cardiology

## 2022-01-06 VITALS — BP 133/84 | HR 88 | Temp 97.8°F | Resp 16 | Ht 74.0 in | Wt 214.0 lb

## 2022-01-06 DIAGNOSIS — Z8249 Family history of ischemic heart disease and other diseases of the circulatory system: Secondary | ICD-10-CM

## 2022-01-06 DIAGNOSIS — E782 Mixed hyperlipidemia: Secondary | ICD-10-CM | POA: Diagnosis not present

## 2022-01-06 DIAGNOSIS — G4733 Obstructive sleep apnea (adult) (pediatric): Secondary | ICD-10-CM

## 2022-01-06 DIAGNOSIS — I2510 Atherosclerotic heart disease of native coronary artery without angina pectoris: Secondary | ICD-10-CM | POA: Diagnosis not present

## 2022-01-06 DIAGNOSIS — I10 Essential (primary) hypertension: Secondary | ICD-10-CM

## 2022-01-06 DIAGNOSIS — R0609 Other forms of dyspnea: Secondary | ICD-10-CM | POA: Diagnosis not present

## 2022-01-06 DIAGNOSIS — I77810 Thoracic aortic ectasia: Secondary | ICD-10-CM

## 2022-01-06 MED ORDER — METOPROLOL TARTRATE 25 MG PO TABS: 25.0000 mg | ORAL_TABLET | Freq: Two times a day (BID) | ORAL | 0 refills | Status: DC

## 2022-01-06 NOTE — Progress Notes (Signed)
Date:  01/06/2022   ID:  Fernando Estrada, DOB 16-Sep-1961, MRN [id number]  PCP:  Sueanne Margarita, DO  Cardiologist:  Rex Kras, DO, [facility] (established care 10/10/2021)  Date: 01/06/22 Last Office Visit: 11/25/2021  Chief Complaint  Patient presents with   Shortness of Breath   Follow-up    HPI  Fernando Estrada is a 61 y.o. male who presents to the office with a chief complaint of " evaluation of shortness of breath and discuss test results." Patient's past medical history and cardiovascular risk factors include: Moderate coronary artery calcification (total CAC 111 AU at 65th percentile), mild ascending aorta dilatation (41 mm 10/2021), benign essential hypertension, sleep apnea on CPAP, hyperlipidemia, family history of heart disease.   He is referred to the office at the request of Sueanne Margarita, DO for evaluation of heart disease and dyspnea.  Patient was referred to the office given his history of coronary artery disease in the family (father had 2 myocardial infarctions in his 51s followed by CABG and mother recently passed secondary to possible cardiac etiology).  Prior to establishing care he was having shortness of breath with effort related activities for the last 1 year.  He underwent an echocardiogram which noted preserved LVEF, normal diastolic function, and no significant valvular heart disease.  Exercise nuclear stress test noted small to medium sized reversible defect in the apical lateral and inferior regions overall low risk study.  Since ischemic work-up patient has been followed up in his antihypertensive medications and antianginal therapies have been increased in a stepwise fashion.  His blood pressures are now better controlled on current regiment with SBP ranging between 125-128 mmHg.  He has not required the use sublingual nitroglycerin tablet.  However, he continues to note effort related dyspnea with regular day-to-day activities especially yard work.  Patient states  that his overall physical endurance has reduced compared to 1 year ago.  FUNCTIONAL STATUS: 3-4 times a week patient ambulates about 2 miles regularly.  He walks 1 mile in 15 to 16 minutes.  ALLERGIES: No Known Allergies  MEDICATION LIST PRIOR TO VISIT: Current Meds  Medication Sig   aspirin EC 81 MG tablet Take 1 tablet (81 mg total) by mouth daily. Swallow whole.   atorvastatin (LIPITOR) 20 MG tablet Take 20 mg by mouth daily.   doxycycline (MONODOX) 100 MG capsule Take 100 mg by mouth 2 (two) times daily.   hydrochlorothiazide (HYDRODIURIL) 25 MG tablet Take 1 tablet (25 mg total) by mouth every morning.   losartan (COZAAR) 25 MG tablet Take 1 tablet (25 mg total) by mouth daily at 10 pm.   metoprolol tartrate (LOPRESSOR) 25 MG tablet Take 1 tablet (25 mg total) by mouth 2 (two) times daily for 15 days.   nitroGLYCERIN (NITROSTAT) 0.4 MG SL tablet PLACE 1 TABLET UNDER TONGUE EVERY 5 MINUTES AS NEEDED FOR CHEST PAIN; MORE THAN 2 TABS 5 MINUTES APART GO TO ER VIA EMS     PAST MEDICAL HISTORY: Past Medical History:  Diagnosis Date   Arthritis    Left shoulder and elbow    Dysuria    Elbow pain    left   Headache    Mild and consistant    Hemorrhoid    Hyperlipidemia    Low libido    Nocturia    Numbness    left lower leg   Shoulder pain    left   Sleep apnea    uses CPAP PRN   Tinnitus  Urinary urgency     PAST SURGICAL HISTORY: Past Surgical History:  Procedure Laterality Date   COLONOSCOPY     KNEE ARTHROTOMY Bilateral    L-1994, R-1996   VASECTOMY      FAMILY HISTORY: The patient family history includes Anorexia nervosa in his sister; Colon cancer in his father; Diabetes in his father; Heart attack in his father; Heart disease in his father; Heart failure in his brother and sister; Throat cancer in his brother.  SOCIAL HISTORY:  The patient  reports that he has never smoked. He has never used smokeless tobacco. He reports current alcohol use. He reports  that he does not use drugs.  REVIEW OF SYSTEMS: Review of Systems  Constitutional: Negative for chills and fever.  HENT:  Negative for hoarse voice and nosebleeds.   Eyes:  Negative for discharge, double vision and pain.  Cardiovascular:  Positive for dyspnea on exertion. Negative for chest pain, claudication, leg swelling, near-syncope, orthopnea, palpitations, paroxysmal nocturnal dyspnea and syncope.  Respiratory:  Negative for hemoptysis and shortness of breath.   Musculoskeletal:  Negative for muscle cramps and myalgias.  Gastrointestinal:  Negative for abdominal pain, constipation, diarrhea, hematemesis, hematochezia, melena, nausea and vomiting.  Neurological:  Negative for dizziness and light-headedness.   PHYSICAL EXAM: Vitals with BMI 01/06/2022 11/25/2021 11/25/2021  Height 6\' 2"  - 6\' 2"   Weight 214 lbs - 215 lbs 3 oz  BMI 69.62 - 95.28  Systolic 413 244 010  Diastolic 84 272 97  Pulse 88 90 86    CONSTITUTIONAL: Well-developed and well-nourished. No acute distress.  SKIN: Skin is warm and dry. No rash noted. No cyanosis. No pallor. No jaundice HEAD: Normocephalic and atraumatic.  EYES: No scleral icterus MOUTH/THROAT: Moist oral membranes.  NECK: No JVD present. No thyromegaly noted. No carotid bruits  LYMPHATIC: No visible cervical adenopathy.  CHEST Normal respiratory effort. No intercostal retractions  LUNGS: Clear to auscultation bilaterally.  No stridor. No wheezes. No rales.  CARDIOVASCULAR: Regular rate and rhythm, positive S1-S2, no murmurs rubs or gallops appreciated ABDOMINAL: Soft, nontender, nondistended, positive bowel sounds in all 4 quadrants no apparent ascites.  EXTREMITIES: No peripheral edema, 2+ DP and PT pulses. HEMATOLOGIC: No significant bruising NEUROLOGIC: Oriented to person, place, and time. Nonfocal. Normal muscle tone.  PSYCHIATRIC: Normal mood and affect. Normal behavior. Cooperative  CARDIAC DATABASE: EKG: 10/10/2021: Normal sinus, 84  bpm, normal axis, without underlying ischemia or injury pattern.   Echocardiogram: 10/18/2021: Normal LV systolic function with visual EF 60-65%. Left ventricle cavity is normal in size. Mild to moderate left ventricular hypertrophy. Normal global wall motion. Normal diastolic filling pattern, normal LAP. Mild (Grade I) mitral regurgitation. Mild tricuspid regurgitation. No evidence of pulmonary hypertension. The aortic root is mildly dilated (Sinotubular junction 61mm). No prior study for comparison.   Stress Testing: Exercise nuclear stress test 11/12/2021:  Normal ECG stress. The patient exercised for 6 minutes and 0 seconds of a Bruce protocol, achieving approximately 7.05 METs. Patient was hypertensive but at rest and with stress with blood pressure 148/108 mmHg with peak 160/102 mmHg.  Myocardial perfusion is abnormal. There is a small to medium reversible mild defect in the lateral, inferior and apical regions.  Overall LV systolic function is normal without regional wall motion abnormalities. Stress LV EF: 57%.  No previous exam available for comparison. Low risk study.    Coronary calcium score 11/01/2021: 1. Coronary calcium score of 111 is at the 65th percentile for the patient's age, sex and race.  2. Mild aneurysmal dilatation of the ascending thoracic aorta measuring up to 4.1 cm. Recommend annual imaging followup by CTA or MRA. This recommendation follows 2010 ACCF/AHA/AATS/ACR/ASA/SCA/SCAI/SIR/STS/SVM Guidelines for the Diagnosis and Management of Patients with Thoracic Aortic Disease. Circulation. 2010; 121: D357-S177. Aortic aneurysm NOS (ICD10-I71.9) 3. Bilateral gynecomastia.  LABORATORY DATA: No flowsheet data found.  CMP Latest Ref Rng & Units 12/06/2021 11/13/2021 11/01/2021  Glucose 70 - 99 mg/dL 108(H) 97 98  BUN 8 - 27 mg/dL 15 13 19   Creatinine 0.76 - 1.27 mg/dL 1.15 1.13 1.15  Sodium 134 - 144 mmol/L 138 139 142  Potassium 3.5 - 5.2 mmol/L 4.5 5.0 5.4(H)   Chloride 96 - 106 mmol/L 96 100 103  CO2 20 - 29 mmol/L 27 25 25   Calcium 8.6 - 10.2 mg/dL 10.0 9.5 9.7    Lipid Panel  No results found for: CHOL, TRIG, HDL, CHOLHDL, VLDL, LDLCALC, LDLDIRECT, LABVLDL  No components found for: NTPROBNP No results for input(s): PROBNP in the last 8760 hours. No results for input(s): TSH in the last 8760 hours.  BMP Recent Labs    11/01/21 1015 11/13/21 0956 12/06/21 0953  NA 142 139 138  K 5.4* 5.0 4.5  CL 103 100 96  CO2 25 25 27   GLUCOSE 98 97 108*  BUN 19 13 15   CREATININE 1.15 1.13 1.15  CALCIUM 9.7 9.5 10.0    HEMOGLOBIN A1C No results found for: HGBA1C, MPG  External Labs: Collected: 09/06/2021 provided by PCP. Sodium 136, potassium 4.5, chloride 102, bicarb 23, BUN 20, creatinine 1.2. AST 16, ALT 11, alkaline phosphatase 69. Hemoglobin 14.7 g/dL, hematocrit 44.7% Total cholesterol 116, triglycerides 65, LDL 60, HDL 43, non-HDL 73. TSH 0.99  IMPRESSION:    ICD-10-CM   1. Dyspnea on exertion  R06.09 metoprolol tartrate (LOPRESSOR) 25 MG tablet    Basic metabolic panel    CT CORONARY MORPH W/CTA COR W/SCORE W/CA W/CM &/OR WO/CM    2. Benign hypertension  I10     3. Coronary atherosclerosis due to calcified coronary lesion  I25.10 metoprolol tartrate (LOPRESSOR) 25 MG tablet   L39.03 Basic metabolic panel    CT CORONARY MORPH W/CTA COR W/SCORE W/CA W/CM &/OR WO/CM    4. Mixed hyperlipidemia  E78.2     5. Family history of ischemic heart disease  Z82.49 CT CORONARY MORPH W/CTA COR W/SCORE W/CA W/CM &/OR WO/CM    6. Ascending aorta dilatation (HCC)  I77.810     7. Obstructive sleep apnea syndrome  G47.33        RECOMMENDATIONS: Fernando Estrada is a 61 y.o. male whose past medical history and cardiac risk factors include: Moderate coronary artery calcification (total CAC 111 AU at 65th percentile), mild ascending aorta dilatation (41 mm 10/2021), benign essential hypertension, sleep apnea on CPAP, hyperlipidemia, family  history of heart disease.   Coronary atherosclerosis due to calcified coronary lesion Total CAC 111AU, 65th percentile Continue statin therapy. Continue aspirin 81 mg p.o. daily Echo: LVEF 60 to 65%, mild to moderate LVH, mild MR, mild TR, aortic root mildly dilated at 40 mm.  MPI: Small to medium reversible mild perfusion defect in the lateral, inferior and apical regions.  LVEF 57%, low risk study.  Shared decision was to proceed with coronary CTA to evaluate for obstructive CAD given his dyspnea on exertion, moderate CAC, and given his co-morbidities conditions. Sublingual nitroglycerin tablets to use on a as needed basis if needed for anginal equivalents.  If he requires more than 2  tablets 5 minutes apart he is asked to go to the closest ER via EMS for further evaluation.  Dyspnea on exertion Continues despite better blood pressure management. Has undergone echocardiogram and stress test as discussed above shared decision is to proceed with coronary CTA.   Further recommendations to follow.   Overall euvolemic and not in congestive heart failure.  Benign hypertension Home blood pressures are better controlled, blood pressure log reviewed findings noted above Office blood pressures are better controlled. Has tolerated hydrochlorothiazide and losartan well and follow-up blood work notes stable renal function and electrolytes.  Ascending aorta dilatation: Noted on calcium scoring study 11//2022 Ascending aorta measuring 41 mm Initiated blood pressure management as discussed above Will need follow-up in 1 year -will be arranged at the next visit  Mixed hyperlipidemia Currently on atorvastatin.   He denies myalgia or other side effects. Most recent lipids dated 09/06/2021 reviewed as noted above.  LDL at goal Currently managed by primary care provider.  Obstructive sleep apnea syndrome Educated on importance of CPAP compliance working on better masking fitting.   FINAL MEDICATION  LIST END OF ENCOUNTER: Meds ordered this encounter  Medications   metoprolol tartrate (LOPRESSOR) 25 MG tablet    Sig: Take 1 tablet (25 mg total) by mouth 2 (two) times daily for 15 days.    Dispense:  30 tablet    Refill:  0     There are no discontinued medications.   Current Outpatient Medications:    aspirin EC 81 MG tablet, Take 1 tablet (81 mg total) by mouth daily. Swallow whole., Disp: 30 tablet, Rfl: 11   atorvastatin (LIPITOR) 20 MG tablet, Take 20 mg by mouth daily., Disp: , Rfl:    doxycycline (MONODOX) 100 MG capsule, Take 100 mg by mouth 2 (two) times daily., Disp: , Rfl:    hydrochlorothiazide (HYDRODIURIL) 25 MG tablet, Take 1 tablet (25 mg total) by mouth every morning., Disp: 90 tablet, Rfl: 0   losartan (COZAAR) 25 MG tablet, Take 1 tablet (25 mg total) by mouth daily at 10 pm., Disp: 90 tablet, Rfl: 0   metoprolol tartrate (LOPRESSOR) 25 MG tablet, Take 1 tablet (25 mg total) by mouth 2 (two) times daily for 15 days., Disp: 30 tablet, Rfl: 0   nitroGLYCERIN (NITROSTAT) 0.4 MG SL tablet, PLACE 1 TABLET UNDER TONGUE EVERY 5 MINUTES AS NEEDED FOR CHEST PAIN; MORE THAN 2 TABS 5 MINUTES APART GO TO ER VIA EMS, Disp: 25 tablet, Rfl: 0  Orders Placed This Encounter  Procedures   CT CORONARY MORPH W/CTA COR W/SCORE W/CA W/CM &/OR WO/CM   Basic metabolic panel    There are no Patient Instructions on file for this visit.   --Continue cardiac medications as reconciled in final medication list. --Return in about 4 weeks (around 02/03/2022) for Follow up, Dyspnea, Review test results. Or sooner if needed. --Continue follow-up with your primary care physician regarding the management of your other chronic comorbid conditions.  Patient's questions and concerns were addressed to his satisfaction. He voices understanding of the instructions provided during this encounter.   This note was created using a voice recognition software as a result there may be grammatical errors  inadvertently enclosed that do not reflect the nature of this encounter. Every attempt is made to correct such errors.  Rex Kras, [place], [facility]  Pager: [phone] Office: [phone]

## 2022-01-09 DIAGNOSIS — I2510 Atherosclerotic heart disease of native coronary artery without angina pectoris: Secondary | ICD-10-CM | POA: Diagnosis not present

## 2022-01-09 DIAGNOSIS — R0609 Other forms of dyspnea: Secondary | ICD-10-CM | POA: Diagnosis not present

## 2022-01-09 DIAGNOSIS — I2584 Coronary atherosclerosis due to calcified coronary lesion: Secondary | ICD-10-CM | POA: Diagnosis not present

## 2022-01-10 LAB — BASIC METABOLIC PANEL
BUN/Creatinine Ratio: 13 (ref 10–24)
BUN: 16 mg/dL (ref 8–27)
CO2: 28 mmol/L (ref 20–29)
Calcium: 9.3 mg/dL (ref 8.6–10.2)
Chloride: 98 mmol/L (ref 96–106)
Creatinine, Ser: 1.25 mg/dL (ref 0.76–1.27)
Glucose: 127 mg/dL — ABNORMAL HIGH (ref 70–99)
Potassium: 4.1 mmol/L (ref 3.5–5.2)
Sodium: 141 mmol/L (ref 134–144)
eGFR: 65 mL/min/{1.73_m2} (ref >59)

## 2022-01-20 ENCOUNTER — Telehealth (HOSPITAL_COMMUNITY): Payer: Self-pay | Admitting: Emergency Medicine

## 2022-01-20 ENCOUNTER — Telehealth (HOSPITAL_COMMUNITY): Payer: Self-pay | Admitting: *Deleted

## 2022-01-20 NOTE — Telephone Encounter (Signed)
Patient returning call regarding upcoming cardiac imaging study; pt verbalizes understanding of appt date/time, parking situation and where to check in, pre-test NPO status and medications ordered, and verified current allergies; name and call back number provided for further questions should they arise  Fernando Clement RN Navigator Cardiac Imaging [facility] [phone] office [phone] cell  Patient to take 25mg  metoprolol tartrate two hours prior to cardiac CT scan. He is aware to arrive at 9am for his 9:30am scan.

## 2022-01-20 NOTE — Telephone Encounter (Signed)
Attempted to call patient regarding upcoming cardiac CT appointment. °Left message on voicemail with name and callback number °Patrese Neal RN Navigator Cardiac Imaging °[facility] °[phone] Office °[phone] Cell ° °

## 2022-01-21 ENCOUNTER — Ambulatory Visit (HOSPITAL_COMMUNITY)
Admission: RE | Admit: 2022-01-21 | Discharge: 2022-01-21 | Disposition: A | Discharge status: Home / Self Care | Payer: BC Managed Care – PPO | Source: Ambulatory Visit | Attending: Cardiology | Admitting: Cardiology

## 2022-01-21 ENCOUNTER — Other Ambulatory Visit: Payer: Self-pay

## 2022-01-21 DIAGNOSIS — I517 Cardiomegaly: Secondary | ICD-10-CM | POA: Diagnosis not present

## 2022-01-21 DIAGNOSIS — I2510 Atherosclerotic heart disease of native coronary artery without angina pectoris: Secondary | ICD-10-CM | POA: Diagnosis not present

## 2022-01-21 DIAGNOSIS — R0609 Other forms of dyspnea: Secondary | ICD-10-CM | POA: Insufficient documentation

## 2022-01-21 DIAGNOSIS — Z8249 Family history of ischemic heart disease and other diseases of the circulatory system: Secondary | ICD-10-CM | POA: Insufficient documentation

## 2022-01-21 DIAGNOSIS — I77810 Thoracic aortic ectasia: Secondary | ICD-10-CM | POA: Diagnosis not present

## 2022-01-21 DIAGNOSIS — I2584 Coronary atherosclerosis due to calcified coronary lesion: Secondary | ICD-10-CM | POA: Diagnosis not present

## 2022-01-21 DIAGNOSIS — I700 Atherosclerosis of aorta: Secondary | ICD-10-CM | POA: Insufficient documentation

## 2022-01-21 IMAGING — CT CT HEART MORP W/ CTA COR W/ SCORE W/ CA W/CM &/OR W/O CM
4 of 7 series · 8 of 20 positions shown, 9 images · IV contrast (APPLIED)
Comparison: None.
COMPARISON: None.

Addendum:
EXAM:
OVER-READ INTERPRETATION  CT CHEST

The following report is an over-read performed by radiologist Dr.
Suhandy Chio [REDACTED] on 01/21/2022. This
over-read does not include interpretation of cardiac or coronary
anatomy or pathology. The coronary calcium score/coronary CTA
interpretation by the cardiologist is attached.
HISTORY: Chest pain/anginal equiv, ECGs and troponins normal Chest
pain/anginal equiv, intermediate CAD risk, treadmill candidate
Cardiac/Coronary  CT
TECHNIQUE: The patient was scanned on a Siemens Force scanner.
PROTOCOL: A 120 kV prospective scan was triggered in the descending thoracic
aorta at 111 HU's. Axial non-contrast 3 mm slices were carried out
through the heart. The data set was analyzed on a dedicated work
station and scored using the Agatson method. Gantry rotation speed
was 250 msecs and collimation was .6 mm. No IV beta blockade but
mg of sl NTG was given. The 3D data set was reconstructed in 5%
intervals of the 67-82 % of the R-R cycle. Diastolic phases were
analyzed on a dedicated work station using MPR, MIP and VRT modes.
The patient received 95mL OMNIPAQUE IOHEXOL 350 MG/ML SOLN of
contrast.

[Series 6: ts diast sharp · axial · 0.39mm/px · z∈[-140,-100]mm · 2 of 298 slices shown]
[im 100/298  lung]
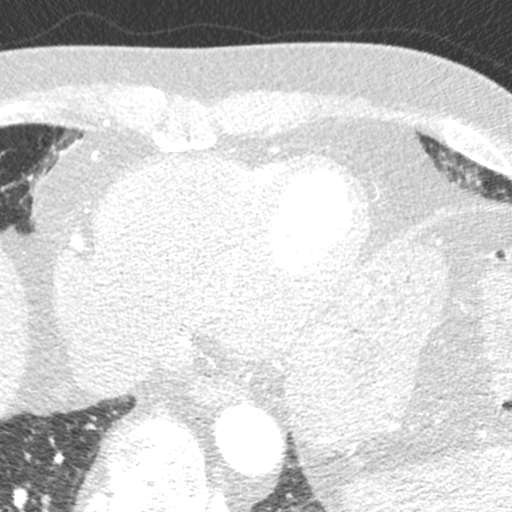
[im 199/298  lung]
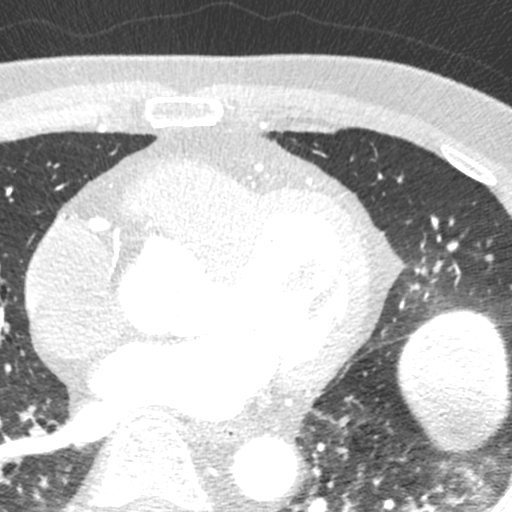

[Series 7: ts syst sharp · axial · 0.39mm/px · z∈[-140,-100]mm · 2 of 298 slices shown]
[im 100/298  lung]
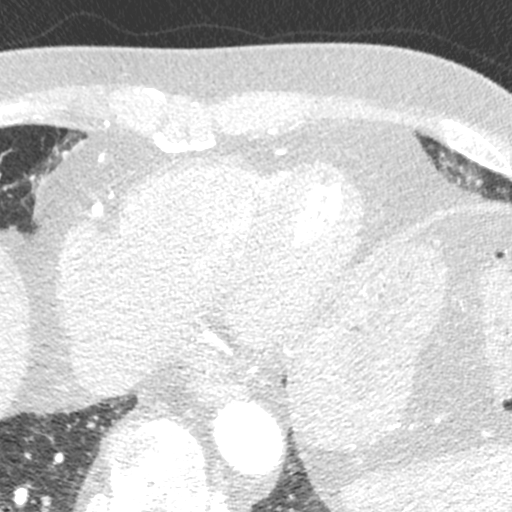
[im 199/298  lung]
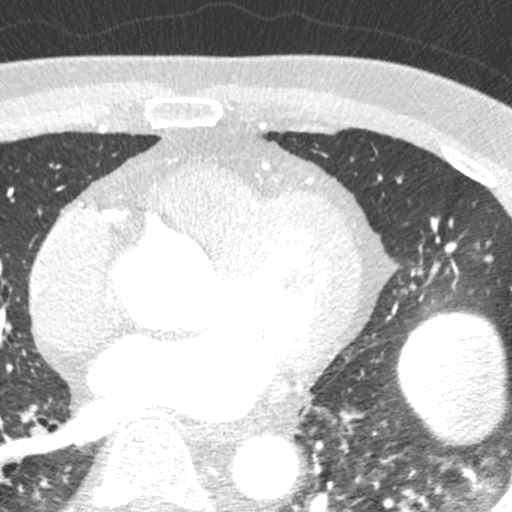

[Series 8: best syst · axial · 0.39mm/px · z∈[-140,-100]mm · 2 of 298 slices shown, 3 images]
[im 100/298  vessel]
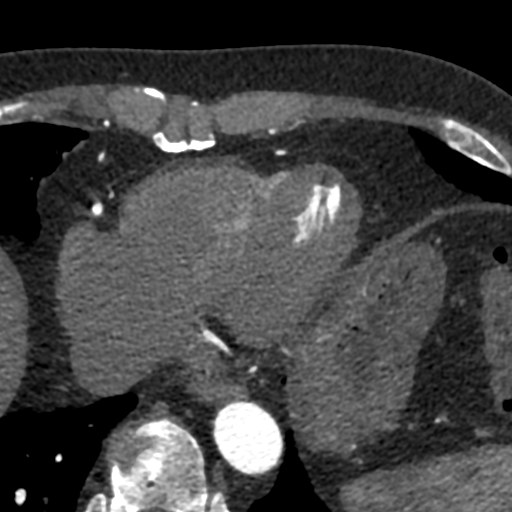
[im 100/298  lung]
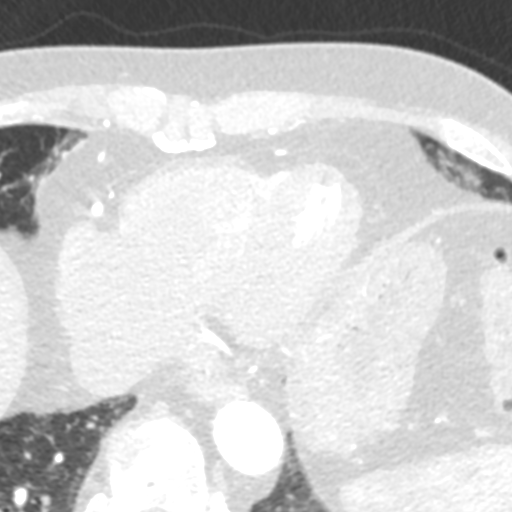
[im 199/298  vessel]
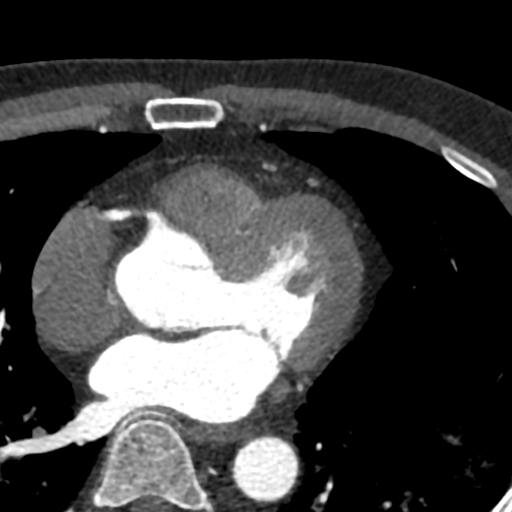

[Series 9: best diast · axial · 0.39mm/px · z∈[-140,-100]mm · 2 of 298 slices shown]
[im 100/298  vessel]
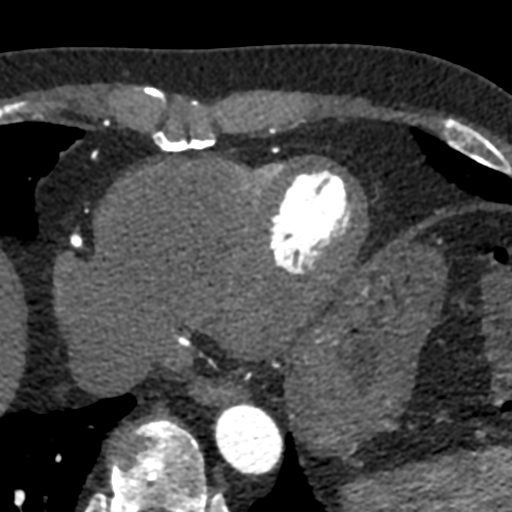
[im 199/298  vessel]
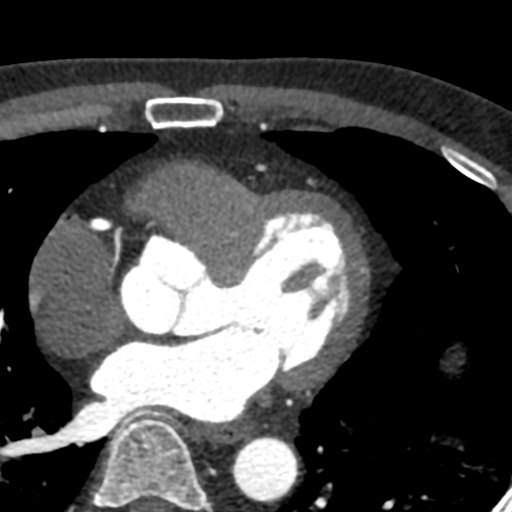

[8 of 20 positions shown; findings below may reference images not displayed]

FINDINGS: Elevation of the left hemidiaphragm. Mild scarring in the lung bases
bilaterally. Aortic atherosclerosis with ectasia of ascending
thoracic aorta (4.0 cm in diameter). Within the visualized portions
of the thorax there are no suspicious appearing pulmonary nodules or
masses, there is no acute consolidative airspace disease, no pleural
effusions, no pneumothorax and no lymphadenopathy. Visualized
portions of the upper abdomen are unremarkable. There are no
aggressive appearing lytic or blastic lesions noted in the
visualized portions of the skeleton.
IMPRESSION: 1.  Aortic Atherosclerosis (859J5-M3P.P).
2. Ectasia of ascending thoracic aorta (4.0 cm in diameter).
Recommend annual imaging followup by CTA or MRA. This recommendation
follows 4090 ACCF/AHA/AATS/ACR/ASA/SCA/JUMPER/ERROL/MOGUEL/DON LOLITO Guidelines
for the Diagnosis and Management of Patients with Thoracic Aortic
Disease. Circulation. 4090; 121: E266-e369. Aortic aneurysm NOS
(859J5-1K9.S).
3. Elevation of the left hemidiaphragm.
FINDINGS: Image quality: Average.

Artifact: Limited.

Coronary artery calcification score:

Left main: 0

Left anterior descending artery:

Left circumflex artery: 0

Right coronary artery: 0

Total coronary calcium score of 72.4, places the patient at 57th
percentile for age and sex matched control.

Coronary arteries: Normal coronary origins.  Right dominance.

Left Main Coronary Artery: The left main is a normal caliber vessel
with a normal take off from the left coronary cusp that bifurcates
to form a left anterior descending artery and a left circumflex
artery. There is no plaque or stenosis.

Left Anterior Descending Coronary Artery: Normal caliber vessel,
wraps the apex, gives off 2 patent diagonal branches. Mild stenosis
(25-49%) due to mixed plaque proximal/mid LAD just before the
takeoff of the second diagonal branch. Mid to distal LAD is patent.

First diagonal branch, normal caliber, bifurcates into superior and
inferior branches. Mild stenosis (25-49%) due to calcified plaque at
the ostial segment but remainder of the vessel is patent.

Second diagonal branch, normal caliber, no evidence of plaque or
stenosis.

Left Circumflex Artery: Normal caliber vessel, non-dominant, travels
within the atrioventricular groove, gives off 2 patent obtuse
marginal branches. The LCX is patent with no evidence of plaque or
stenosis.

Right Coronary Artery: The RCA is dominant with normal take off from
the right coronary cusp. The RCA terminates as a PDA and right
posterolateral branch. Minimal non-calcified plaque within proximal
RCA. Mid and distal RCA patent.

Left Atrium: Grossly normal in size with no left atrial appendage
filling defect.

Left Ventricle: Grossly normal in size. There are no stigmata of
prior infarction. There is no abnormal filling defect.

Pulmonary arteries: Normal in size without proximal filling defect.

Pulmonary veins: Normal pulmonary venous drainage.

Aorta: Mid ascending thoracic aorta mildly dilated (level of the
right pulmonary artery) measures 38mm in double oblique. Aortic
atherosclerosis. No dissection.

Pericardium: Normal thickness with no significant effusion or
calcium present.

Cardiac valves: The aortic valve is trileaflet without significant
calcification. The mitral valve is normal structure without
significant calcification.

Extra-cardiac findings: See attached radiology report for
non-cardiac structures.
IMPRESSION: 1. Total coronary calcium score of 72.4. This was 57th percentile
for age and sex matched control.

2. Normal coronary origin with right dominance.

3. Mid ascending thoracic aorta mildly dilated (level of the right
pulmonary artery) measures 38mm.

4. Aortic atherosclerosis.

5. CAD-RADS = 2 Mild non-obstructive CAD.

Left main: Patent.

LAD: Mild stenosis (25-49%) due to mixed plaque proximal/mid LAD
just before the takeoff of the second diagonal branch. Mid to distal
LAD is patent.

First Diagonal: Mild stenosis (25-49%) due to calcified plaque at
the ostial segment but mid to distal segment patent.

Second Diagonal: Patent.

LCx: Patent.

RCA: Minimal non-calcified plaque within proximal RCA. Mid and
distal RCA patent.

6. Study is sent for CT-FFR to further evaluate the LAD and first
diagonal branch. Findings will be performed and reported separately.

RECOMMENDATIONS:

Consider non-atherosclerotic causes of chest pain. Consider
preventive therapy and risk factor modification.

*** End of Addendum ***
EXAM:
OVER-READ INTERPRETATION  CT CHEST

The following report is an over-read performed by radiologist Dr.
Suhandy Chio [REDACTED] on 01/21/2022. This
over-read does not include interpretation of cardiac or coronary
anatomy or pathology. The coronary calcium score/coronary CTA
interpretation by the cardiologist is attached.
FINDINGS: Elevation of the left hemidiaphragm. Mild scarring in the lung bases
bilaterally. Aortic atherosclerosis with ectasia of ascending
thoracic aorta (4.0 cm in diameter). Within the visualized portions
of the thorax there are no suspicious appearing pulmonary nodules or
masses, there is no acute consolidative airspace disease, no pleural
effusions, no pneumothorax and no lymphadenopathy. Visualized
portions of the upper abdomen are unremarkable. There are no
aggressive appearing lytic or blastic lesions noted in the
visualized portions of the skeleton.
IMPRESSION: 1.  Aortic Atherosclerosis (859J5-M3P.P).
2. Ectasia of ascending thoracic aorta (4.0 cm in diameter).
Recommend annual imaging followup by CTA or MRA. This recommendation
follows 4090 ACCF/AHA/AATS/ACR/ASA/SCA/JUMPER/ERROL/MOGUEL/DON LOLITO Guidelines
for the Diagnosis and Management of Patients with Thoracic Aortic
Disease. Circulation. 4090; 121: E266-e369. Aortic aneurysm NOS
(859J5-1K9.S).
3. Elevation of the left hemidiaphragm.

## 2022-01-21 MED ORDER — IOHEXOL 350 MG/ML SOLN
95.0000 mL | Freq: Once | INTRAVENOUS | Status: AC | PRN
  Administered 2022-01-21: 10:00:00 95 mL via INTRAVENOUS

## 2022-01-21 MED ORDER — NITROGLYCERIN 0.4 MG SL SUBL
0.8000 mg | SUBLINGUAL_TABLET | Freq: Once | SUBLINGUAL | Status: AC
Start: 2022-01-21 — End: 2022-01-21

## 2022-01-21 MED ORDER — NITROGLYCERIN 0.4 MG SL SUBL
SUBLINGUAL_TABLET | SUBLINGUAL | Status: AC
  Administered 2022-01-21: 10:00:00 0.8 mg via SUBLINGUAL
  Filled 2022-01-21: qty 2

## 2022-01-21 NOTE — Progress Notes (Signed)
PT standing.  No dizziness

## 2022-01-22 ENCOUNTER — Ambulatory Visit (HOSPITAL_COMMUNITY)
Admission: RE | Admit: 2022-01-22 | Discharge: 2022-01-22 | Disposition: A | Discharge status: Home / Self Care | Payer: BC Managed Care – PPO | Source: Ambulatory Visit | Attending: Cardiology | Admitting: Cardiology

## 2022-01-22 ENCOUNTER — Other Ambulatory Visit: Payer: Self-pay | Admitting: Cardiology

## 2022-01-22 DIAGNOSIS — R931 Abnormal findings on diagnostic imaging of heart and coronary circulation: Secondary | ICD-10-CM

## 2022-01-22 DIAGNOSIS — R079 Chest pain, unspecified: Secondary | ICD-10-CM | POA: Diagnosis not present

## 2022-01-26 ENCOUNTER — Other Ambulatory Visit: Payer: Self-pay | Admitting: Cardiology

## 2022-01-26 DIAGNOSIS — I10 Essential (primary) hypertension: Secondary | ICD-10-CM

## 2022-01-26 DIAGNOSIS — R0609 Other forms of dyspnea: Secondary | ICD-10-CM

## 2022-02-14 ENCOUNTER — Encounter: Payer: Self-pay | Admitting: Cardiology

## 2022-02-14 ENCOUNTER — Ambulatory Visit: Payer: BC Managed Care – PPO | Admitting: Cardiology

## 2022-02-14 ENCOUNTER — Other Ambulatory Visit: Payer: Self-pay

## 2022-02-14 VITALS — BP 123/88 | HR 73 | Temp 98.2°F | Ht 74.0 in | Wt 215.0 lb

## 2022-02-14 DIAGNOSIS — I77819 Aortic ectasia, unspecified site: Secondary | ICD-10-CM

## 2022-02-14 DIAGNOSIS — E782 Mixed hyperlipidemia: Secondary | ICD-10-CM | POA: Diagnosis not present

## 2022-02-14 DIAGNOSIS — Z8249 Family history of ischemic heart disease and other diseases of the circulatory system: Secondary | ICD-10-CM

## 2022-02-14 DIAGNOSIS — I10 Essential (primary) hypertension: Secondary | ICD-10-CM | POA: Diagnosis not present

## 2022-02-14 DIAGNOSIS — G4733 Obstructive sleep apnea (adult) (pediatric): Secondary | ICD-10-CM

## 2022-02-14 DIAGNOSIS — I2510 Atherosclerotic heart disease of native coronary artery without angina pectoris: Secondary | ICD-10-CM | POA: Diagnosis not present

## 2022-02-14 DIAGNOSIS — R0609 Other forms of dyspnea: Secondary | ICD-10-CM

## 2022-02-14 MED ORDER — HYDROCHLOROTHIAZIDE 25 MG PO TABS: ORAL_TABLET | ORAL | 0 refills | Status: AC

## 2022-02-14 MED ORDER — LOSARTAN POTASSIUM 25 MG PO TABS: 25.0000 mg | ORAL_TABLET | Freq: Every day | ORAL | 1 refills | Status: AC

## 2022-02-14 NOTE — Progress Notes (Signed)
ID:  Fernando Estrada, DOB 1961/10/11, MRN [id number]  PCP:  Sueanne Margarita, DO  Cardiologist:  Rex Kras, DO, [facility] (established care 10/10/2021)  Date: 02/14/22 Last Office Visit: 01/06/2022  Chief Complaint  Patient presents with   Shortness of Breath   Follow-up   Results    HPI  Fernando Estrada is a 61 y.o. 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   He is referred to the office at the request of Sueanne Margarita, DO for evaluation of heart disease and dyspnea.  Referred to the practice by his PCP for evaluation of heart disease given his family history and his symptoms of dyspnea.  Prior to establishing care he was having shortness of breath with effort related activities for at least 1 year.  Echocardiogram noted preserved LVEF, normal diastolic function, no significant valvular heart disease.  Exercise nuclear stress test concerning for a small to medium size, reversible defect in the apical lateral and inferior regions.  Given his symptoms, family history, and exercise MPI results that shared decision was to proceed with coronary CTA.  Results and images independently reviewed with him in great detail and noted below for reference.  Clinically since last office visit he denies angina pectoris.  Shortness of breath remains chronic and stable.  His home blood pressures have improved significantly on current medical therapy and is requesting refills.  FUNCTIONAL STATUS: 3-4 times a week patient ambulates about 2 miles regularly.  He walks 1 mile in 15 to 16  minutes.  ALLERGIES: No Known Allergies  MEDICATION LIST PRIOR TO VISIT: Current Meds  Medication Sig   aspirin EC 81 MG tablet Take 1 tablet (81 mg total) by mouth daily. Swallow whole.   atorvastatin (LIPITOR) 20 MG tablet Take 20 mg by mouth daily.   nitroGLYCERIN (NITROSTAT) 0.4 MG SL tablet PLACE 1 TABLET UNDER TONGUE EVERY 5 MINUTES AS NEEDED FOR CHEST PAIN; MORE THAN 2 TABS 5 MINUTES APART GO TO ER VIA EMS   [DISCONTINUED] hydrochlorothiazide (HYDRODIURIL) 25 MG tablet TAKE 1 TABLET(25 MG) BY MOUTH EVERY MORNING   [DISCONTINUED] losartan (COZAAR) 25 MG tablet TAKE 1 TABLET(25 MG) BY MOUTH DAILY AT 10 PM     PAST MEDICAL HISTORY: Past Medical History:  Diagnosis Date   Arthritis    Left shoulder and elbow    Dysuria    Elbow pain    left   Headache    Mild and consistant    Hemorrhoid    Hyperlipidemia    Low libido    Nocturia    Numbness    left lower leg   Shoulder pain    left   Sleep apnea    uses CPAP PRN   Tinnitus    Urinary urgency     PAST SURGICAL HISTORY: Past Surgical History:  Procedure Laterality Date   COLONOSCOPY     KNEE ARTHROTOMY Bilateral    L-1994, R-1996   VASECTOMY      FAMILY HISTORY: The patient family history includes Anorexia nervosa in his sister; Colon cancer in his father; Diabetes in his father; Heart attack in his father; Heart disease in his father; Heart failure in  his brother and sister; Throat cancer in his brother.  SOCIAL HISTORY:  The patient  reports that he has never smoked. He has never used smokeless tobacco. He reports current alcohol use. He reports that he does not use drugs.  REVIEW OF SYSTEMS: Review of Systems  Constitutional: Negative for chills and fever.  HENT:  Negative for hoarse voice and nosebleeds.   Eyes:  Negative for discharge, double vision and pain.  Cardiovascular:  Positive for dyspnea on exertion. Negative for chest pain, claudication, leg swelling, near-syncope, orthopnea, palpitations,  paroxysmal nocturnal dyspnea and syncope.  Respiratory:  Negative for hemoptysis and shortness of breath.   Musculoskeletal:  Negative for muscle cramps and myalgias.  Gastrointestinal:  Negative for abdominal pain, constipation, diarrhea, hematemesis, hematochezia, melena, nausea and vomiting.  Neurological:  Negative for dizziness and light-headedness.   PHYSICAL EXAM: Vitals with BMI 02/14/2022 01/21/2022 01/21/2022  Height 6\' 2"  - -  Weight 215 lbs - -  BMI 51.76 - -  Systolic 160 737 106  Diastolic 88 70 82  Pulse 73 60 60    CONSTITUTIONAL: Well-developed and well-nourished. No acute distress.  SKIN: Skin is warm and dry. No rash noted. No cyanosis. No pallor. No jaundice HEAD: Normocephalic and atraumatic.  EYES: No scleral icterus MOUTH/THROAT: Moist oral membranes.  NECK: No JVD present. No thyromegaly noted. No carotid bruits  LYMPHATIC: No visible cervical adenopathy.  CHEST Normal respiratory effort. No intercostal retractions  LUNGS: Clear to auscultation bilaterally.  No stridor. No wheezes. No rales.  CARDIOVASCULAR: Regular rate and rhythm, positive S1-S2, no murmurs rubs or gallops appreciated ABDOMINAL: Soft, nontender, nondistended, positive bowel sounds in all 4 quadrants no apparent ascites.  EXTREMITIES: No peripheral edema, 2+ DP and PT pulses. HEMATOLOGIC: No significant bruising NEUROLOGIC: Oriented to person, place, and time. Nonfocal. Normal muscle tone.  PSYCHIATRIC: Normal mood and affect. Normal behavior. Cooperative  No change in physical examination since last office visit.  CARDIAC DATABASE: EKG: 10/10/2021: Normal sinus, 84 bpm, normal axis, without underlying ischemia or injury pattern.   Echocardiogram: 10/18/2021: Normal LV systolic function with visual EF 60-65%. Left ventricle cavity is normal in size. Mild to moderate left ventricular hypertrophy. Normal global wall motion. Normal diastolic filling pattern, normal LAP. Mild (Grade I)  mitral regurgitation. Mild tricuspid regurgitation. No evidence of pulmonary hypertension. The aortic root is mildly dilated (Sinotubular junction 84mm). No prior study for comparison.   Stress Testing: Exercise nuclear stress test 11/12/2021:  Normal ECG stress. The patient exercised for 6 minutes and 0 seconds of a Bruce protocol, achieving approximately 7.05 METs. Patient was hypertensive but at rest and with stress with blood pressure 148/108 mmHg with peak 160/102 mmHg.  Myocardial perfusion is abnormal. There is a small to medium reversible mild defect in the lateral, inferior and apical regions.  Overall LV systolic function is normal without regional wall motion abnormalities. Stress LV EF: 57%.  No previous exam available for comparison. Low risk study.    Coronary calcium score 11/01/2021: 1. Coronary calcium score of 111 is at the 65th percentile for the patient's age, sex and race. 2. Mild aneurysmal dilatation of the ascending thoracic aorta measuring up to 4.1 cm. Recommend annual imaging followup by CTA or MRA. This recommendation follows 2010 ACCF/AHA/AATS/ACR/ASA/SCA/SCAI/SIR/STS/SVM Guidelines for the Diagnosis and Management of Patients with Thoracic Aortic Disease. Circulation. 2010; 121: Y694-W546. Aortic aneurysm NOS (ICD10-I71.9) 3. Bilateral gynecomastia.  LABORATORY DATA: No flowsheet data found.  CMP Latest Ref Rng & Units 01/09/2022 12/06/2021 11/13/2021  Glucose 70 - 99 mg/dL 127(H) 108(H) 97  BUN 8 - 27 mg/dL 16 15 13   Creatinine 0.76 - 1.27 mg/dL 1.25 1.15 1.13  Sodium 134 - 144 mmol/L 141 138 139  Potassium 3.5 - 5.2 mmol/L 4.1 4.5 5.0  Chloride 96 - 106 mmol/L 98 96 100  CO2 20 - 29 mmol/L 28 27 25   Calcium 8.6 - 10.2 mg/dL 9.3 10.0 9.5    Lipid Panel  No results found for: CHOL, TRIG, HDL, CHOLHDL, VLDL, LDLCALC, LDLDIRECT, LABVLDL  No components found for: NTPROBNP No results for input(s): PROBNP in the last 8760 hours. No results for input(s): TSH  in the last 8760 hours.  BMP Recent Labs    11/13/21 0956 12/06/21 0953 01/09/22 1324  NA 139 138 141  K 5.0 4.5 4.1  CL 100 96 98  CO2 25 27 28   GLUCOSE 97 108* 127*  BUN 13 15 16   CREATININE 1.13 1.15 1.25  CALCIUM 9.5 10.0 9.3    HEMOGLOBIN A1C No results found for: HGBA1C, MPG  External Labs: Collected: 09/06/2021 provided by PCP. Sodium 136, potassium 4.5, chloride 102, bicarb 23, BUN 20, creatinine 1.2. AST 16, ALT 11, alkaline phosphatase 69. Hemoglobin 14.7 g/dL, hematocrit 44.7% Total cholesterol 116, triglycerides 65, LDL 60, HDL 43, non-HDL 73. TSH 0.99  IMPRESSION:    ICD-10-CM   1. Nonobstructive atherosclerosis of coronary artery  I25.10     2. Coronary atherosclerosis due to calcified coronary lesion  I25.10    I25.84     3. Aortic ectasia (HCC)  I77.819 PCV ECHOCARDIOGRAM COMPLETE    4. Dyspnea on exertion  R06.09 hydrochlorothiazide (HYDRODIURIL) 25 MG tablet    losartan (COZAAR) 25 MG tablet    5. Benign hypertension  I10 hydrochlorothiazide (HYDRODIURIL) 25 MG tablet    losartan (COZAAR) 25 MG tablet       RECOMMENDATIONS: Fernando Estrada is a 61 y.o. 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   Nonobstructive atherosclerosis of coronary artery / Coronary atherosclerosis due to calcified coronary lesion Total CAC 72.4 AU, 57 percentile. Echo: LVEF 60 to 65%, mild to moderate LVH, mild MR, mild TR, aortic root mildly dilated at 40 mm.  MPI: Small to medium reversible mild perfusion defect in the lateral, inferior and apical regions.  LVEF 57%, low risk study.  Coronary CTA: Mild nonobstructive CAD. Continue aspirin and statin therapy. Continue risk factor modifications for  secondary prevention. No additional cardiovascular testing warranted at this time. Monitor for now  Aortic ectasia (HCC) Had a coronary calcium score patient was noted to have an ascending aorta measuring 41 mm.  On recent coronary CTA measured double oblique fashion ascending aorta was noted to be 38 mm. Recommend an annual follow-up echocardiogram to reevaluate the ascending aortic dimensions. Educated on importance of blood pressure management. Medications refilled.  Dyspnea on exertion Recommend follow-up with PCP for additional evaluation and management  Benign hypertension Home blood pressures are well controlled. Medications reconciled. Refill hydrochlorothiazide and losartan.  Obstructive sleep apnea syndrome Educated on importance of CPAP compliance working on better masking fitting.   During today's encounter discussed management of at least 2 chronic comorbid conditions, independently reviewed results/images of the coronary CTA with the patient, refilled antihypertensive medications at x2, and ordered additional diagnostic work-up  to be done prior to the next office visit.  FINAL MEDICATION LIST END OF ENCOUNTER: Meds ordered this encounter  Medications   hydrochlorothiazide (HYDRODIURIL) 25 MG tablet    Sig: TAKE 1 TABLET(25 MG) BY MOUTH EVERY MORNING    Dispense:  90 tablet    Refill:  0   losartan (COZAAR) 25 MG tablet    Sig: Take 1 tablet (25 mg total) by mouth daily.    Dispense:  90 tablet    Refill:  1     Medications Discontinued During This Encounter  Medication Reason   metoprolol tartrate (LOPRESSOR) 25 MG tablet Completed Course   doxycycline (MONODOX) 100 MG capsule Completed Course   losartan (COZAAR) 25 MG tablet Reorder   hydrochlorothiazide (HYDRODIURIL) 25 MG tablet Reorder     Current Outpatient Medications:    aspirin EC 81 MG tablet, Take 1 tablet (81 mg total) by mouth daily. Swallow whole., Disp: 30 tablet, Rfl: 11   atorvastatin  (LIPITOR) 20 MG tablet, Take 20 mg by mouth daily., Disp: , Rfl:    nitroGLYCERIN (NITROSTAT) 0.4 MG SL tablet, PLACE 1 TABLET UNDER TONGUE EVERY 5 MINUTES AS NEEDED FOR CHEST PAIN; MORE THAN 2 TABS 5 MINUTES APART GO TO ER VIA EMS, Disp: 25 tablet, Rfl: 0   hydrochlorothiazide (HYDRODIURIL) 25 MG tablet, TAKE 1 TABLET(25 MG) BY MOUTH EVERY MORNING, Disp: 90 tablet, Rfl: 0   losartan (COZAAR) 25 MG tablet, Take 1 tablet (25 mg total) by mouth daily., Disp: 90 tablet, Rfl: 1  Orders Placed This Encounter  Procedures   PCV ECHOCARDIOGRAM COMPLETE    There are no Patient Instructions on file for this visit.   --Continue cardiac medications as reconciled in final medication list. --Return in about 1 year (around 02/14/2023). Or sooner if needed. --Continue follow-up with your primary care physician regarding the management of your other chronic comorbid conditions.  Patient's questions and concerns were addressed to his satisfaction. He voices understanding of the instructions provided during this encounter.   This note was created using a voice recognition software as a result there may be grammatical errors inadvertently enclosed that do not reflect the nature of this encounter. Every attempt is made to correct such errors.  Rex Kras, [place], [facility]  Pager: [phone] Office: [phone]

## 2022-02-19 DIAGNOSIS — B349 Viral infection, unspecified: Secondary | ICD-10-CM | POA: Diagnosis not present

## 2022-02-19 DIAGNOSIS — J9801 Acute bronchospasm: Secondary | ICD-10-CM | POA: Diagnosis not present

## 2022-02-23 ENCOUNTER — Encounter: Payer: Self-pay | Admitting: Cardiology

## 2022-02-25 DIAGNOSIS — R058 Other specified cough: Secondary | ICD-10-CM | POA: Diagnosis not present

## 2022-02-28 DIAGNOSIS — G4733 Obstructive sleep apnea (adult) (pediatric): Secondary | ICD-10-CM | POA: Diagnosis not present

## 2022-03-10 ENCOUNTER — Ambulatory Visit: Payer: BC Managed Care – PPO | Admitting: Neurology

## 2022-04-11 DIAGNOSIS — E785 Hyperlipidemia, unspecified: Secondary | ICD-10-CM | POA: Diagnosis not present

## 2022-04-11 DIAGNOSIS — Z125 Encounter for screening for malignant neoplasm of prostate: Secondary | ICD-10-CM | POA: Diagnosis not present

## 2022-04-18 DIAGNOSIS — Z1331 Encounter for screening for depression: Secondary | ICD-10-CM | POA: Diagnosis not present

## 2022-04-18 DIAGNOSIS — Z1339 Encounter for screening examination for other mental health and behavioral disorders: Secondary | ICD-10-CM | POA: Diagnosis not present

## 2022-04-18 DIAGNOSIS — I10 Essential (primary) hypertension: Secondary | ICD-10-CM | POA: Diagnosis not present

## 2022-04-18 DIAGNOSIS — Z0000 Encounter for general adult medical examination without abnormal findings: Secondary | ICD-10-CM | POA: Diagnosis not present

## 2022-11-05 ENCOUNTER — Ambulatory Visit: Payer: BC Managed Care – PPO | Admitting: Adult Health

## 2022-12-30 ENCOUNTER — Other Ambulatory Visit: Payer: Self-pay | Admitting: Cardiology

## 2022-12-30 DIAGNOSIS — L570 Actinic keratosis: Secondary | ICD-10-CM | POA: Diagnosis not present

## 2022-12-30 DIAGNOSIS — L821 Other seborrheic keratosis: Secondary | ICD-10-CM | POA: Diagnosis not present

## 2022-12-30 DIAGNOSIS — L218 Other seborrheic dermatitis: Secondary | ICD-10-CM | POA: Diagnosis not present

## 2022-12-30 DIAGNOSIS — I2510 Atherosclerotic heart disease of native coronary artery without angina pectoris: Secondary | ICD-10-CM

## 2022-12-30 DIAGNOSIS — L812 Freckles: Secondary | ICD-10-CM | POA: Diagnosis not present

## 2022-12-30 DIAGNOSIS — D1801 Hemangioma of skin and subcutaneous tissue: Secondary | ICD-10-CM | POA: Diagnosis not present

## 2023-02-16 ENCOUNTER — Ambulatory Visit: Payer: BC Managed Care – PPO

## 2023-02-16 DIAGNOSIS — I77819 Aortic ectasia, unspecified site: Secondary | ICD-10-CM

## 2023-02-16 DIAGNOSIS — I10 Essential (primary) hypertension: Secondary | ICD-10-CM | POA: Diagnosis not present

## 2023-02-24 ENCOUNTER — Ambulatory Visit: Payer: BC Managed Care – PPO | Admitting: Cardiology

## 2023-02-24 ENCOUNTER — Encounter: Payer: Self-pay | Admitting: Cardiology

## 2023-02-24 VITALS — BP 119/78 | HR 73 | Resp 17 | Ht 74.0 in | Wt 214.0 lb

## 2023-02-24 DIAGNOSIS — I77819 Aortic ectasia, unspecified site: Secondary | ICD-10-CM

## 2023-02-24 DIAGNOSIS — I2510 Atherosclerotic heart disease of native coronary artery without angina pectoris: Secondary | ICD-10-CM

## 2023-02-24 DIAGNOSIS — I2584 Coronary atherosclerosis due to calcified coronary lesion: Secondary | ICD-10-CM | POA: Diagnosis not present

## 2023-02-24 DIAGNOSIS — I10 Essential (primary) hypertension: Secondary | ICD-10-CM | POA: Diagnosis not present

## 2023-02-24 DIAGNOSIS — R0609 Other forms of dyspnea: Secondary | ICD-10-CM | POA: Diagnosis not present

## 2023-02-24 NOTE — Progress Notes (Signed)
ID:  Fernando Estrada, DOB 1961-04-15, MRN [id number]  PCP:  Sueanne Margarita, DO  Cardiologist:  Rex Kras, DO, [facility] (established care 10/10/2021)  Date: 02/24/23 Last Office Visit: 02/14/2022  Chief Complaint  Patient presents with   Follow-up    1 year -coronary artery calcification    HPI  Danek Wiederkehr is a 62 y.o. male whose past medical history and cardiovascular risk factors include: Mild coronary artery calcification (total CAC 72.4 EU at 57th percentile), mild nonobstructive CAD, mild ascending aorta dilatation (38 mm 01/21/2022), benign essential hypertension, sleep apnea on CPAP, hyperlipidemia, family history of heart disease.   Initially referred to the practice for evaluation of heart disease and dyspnea.  He underwent workup which included echocardiogram, nuclear stress test followed by coronary CTA.  He now presents for 1 year follow-up visit.  Doing well from a cardiovascular standpoint.  Denies anginal discomfort or heart failure symptoms.  Overall functional capacity remains good he exercises daily, enjoys gardening, goes to the gym at least twice a week.  ALLERGIES: No Known Allergies  MEDICATION LIST PRIOR TO VISIT: Current Meds  Medication Sig   ASPIRIN LOW DOSE 81 MG tablet TAKE 1 TABLET(81 MG) BY MOUTH DAILY. SWALLOW WHOLE   atorvastatin (LIPITOR) 20 MG tablet Take 20 mg by mouth daily.   hydrochlorothiazide (HYDRODIURIL) 25 MG tablet TAKE 1 TABLET(25 MG) BY MOUTH EVERY MORNING   losartan (COZAAR) 25 MG tablet Take 1 tablet (25 mg total) by mouth daily.   nitroGLYCERIN (NITROSTAT) 0.4 MG SL tablet PLACE 1 TABLET UNDER TONGUE EVERY 5 MINUTES AS NEEDED FOR CHEST PAIN; MORE THAN 2 TABS 5 MINUTES APART GO TO ER VIA EMS     PAST MEDICAL HISTORY: Past Medical History:  Diagnosis Date   Arthritis    Left shoulder and elbow    Dysuria    Elbow pain    left   Headache    Mild and consistant    Hemorrhoid    Hyperlipidemia    Low libido    Nocturia     Numbness    left lower leg   Shoulder pain    left   Sleep apnea    uses CPAP PRN   Tinnitus    Urinary urgency     PAST SURGICAL HISTORY: Past Surgical History:  Procedure Laterality Date   COLONOSCOPY     KNEE ARTHROTOMY Bilateral    L-1994, R-1996   VASECTOMY      FAMILY HISTORY: The patient family history includes Anorexia nervosa in his sister; Colon cancer in his father; Diabetes in his father; Heart attack in his father; Heart disease in his father; Heart failure in his brother and sister; Throat cancer in his brother.  SOCIAL HISTORY:  The patient  reports that he has never smoked. He has never used smokeless tobacco. He reports current alcohol use. He reports that he does not use drugs.  REVIEW OF SYSTEMS: Review of Systems  Constitutional: Negative for chills and fever.  HENT:  Negative for hoarse voice and nosebleeds.   Eyes:  Negative for discharge, double vision and pain.  Cardiovascular:  Negative for chest pain, claudication, dyspnea on exertion, leg swelling, near-syncope, orthopnea, palpitations, paroxysmal nocturnal dyspnea and syncope.  Respiratory:  Negative for hemoptysis and shortness of breath.   Musculoskeletal:  Negative for muscle cramps and myalgias.  Gastrointestinal:  Negative for abdominal pain, constipation, diarrhea, hematemesis, hematochezia, melena, nausea and vomiting.  Neurological:  Negative for dizziness and light-headedness.    PHYSICAL  EXAM:    02/24/2023    9:56 AM 02/14/2022   11:45 AM 01/21/2022   10:00 AM  Vitals with BMI  Height '6\' 2"'$  '6\' 2"'$    Weight 214 lbs 215 lbs   BMI 0000000 [id number]   Systolic [id number]  Diastolic 78 88 70  Pulse 73 73 60    Physical Exam  Constitutional: No distress.  Age appropriate, hemodynamically stable.   Neck: No JVD present.  Cardiovascular: Normal rate, regular rhythm, S1 normal, S2 normal, intact distal pulses and normal pulses. Exam reveals no gallop, no S3 and no S4.  No murmur  heard. Pulses:      Dorsalis pedis pulses are 2+ on the right side and 2+ on the left side.       Posterior tibial pulses are 2+ on the right side and 2+ on the left side.  Pulmonary/Chest: Effort normal and breath sounds normal. No stridor. He has no wheezes. He has no rales.  Abdominal: Soft. Bowel sounds are normal. He exhibits no distension. There is no abdominal tenderness.  Musculoskeletal:        General: No edema.     Cervical back: Neck supple.  Neurological: He is alert and oriented to person, place, and time. He has intact cranial nerves (2-12).  Skin: Skin is warm and moist.   CARDIAC DATABASE: EKG: 02/24/2023: Sinus rhythm, 76 bpm, normal axis, without underlying ischemia or injury pattern.   Echocardiogram: 10/18/2021: Normal LV systolic function with visual EF 60-65%. Left ventricle cavity is normal in size. Mild to moderate left ventricular hypertrophy. Normal global wall motion. Normal diastolic filling pattern, normal LAP. Mild (Grade I) mitral regurgitation. Mild tricuspid regurgitation. No evidence of pulmonary hypertension. The aortic root is mildly dilated (Sinotubular junction 90m). No prior study for comparison.   02/16/2023:  Normal LV systolic function with visual EF 55-60%. Left ventricle cavity is normal in size. Normal left ventricular wall thickness. Normal global wall motion. Normal diastolic filling pattern. Calculated EF 65%. Structurally normal tricuspid valve. Mild tricuspid regurgitation. No evidence of pulmonary hypertension. The aortic root is mildly dilated at 3.9 cm STJ. Mildly dilated ascending aorta at 4.0 cm. No significant change compared to 01/2021.   Stress Testing: Exercise nuclear stress test 11/12/2021:  Normal ECG stress. The patient exercised for 6 minutes and 0 seconds of a Bruce protocol, achieving approximately 7.05 METs. Patient was hypertensive but at rest and with stress with blood pressure 148/108 mmHg with peak 160/102 mmHg.   Myocardial perfusion is abnormal. There is a small to medium reversible mild defect in the lateral, inferior and apical regions.  Overall LV systolic function is normal without regional wall motion abnormalities. Stress LV EF: 57%.  No previous exam available for comparison. Low risk study.    Coronary calcium score 11/01/2021: 1. Coronary calcium score of 111 is at the 65th percentile for the patient's age, sex and race. 2. Mild aneurysmal dilatation of the ascending thoracic aorta measuring up to 4.1 cm. Recommend annual imaging followup by CTA or MRA. This recommendation follows 2010 ACCF/AHA/AATS/ACR/ASA/SCA/SCAI/SIR/STS/SVM Guidelines for the Diagnosis and Management of Patients with Thoracic Aortic Disease. Circulation. 2010; [id number] Aortic aneurysm NOS (ICD10-I71.9) 3. Bilateral gynecomastia.  CCTA 01/21/2022 1. Total coronary calcium score of 72.4. This was 57th percentile for age and sex matched control. 2. Normal coronary origin with right dominance. 3. Mid ascending thoracic aorta mildly dilated (level of the right pulmonary artery) measures 357m 4. Aortic atherosclerosis. 5. CAD-RADS = 2 Mild  non-obstructive CAD. Left main: Patent. LAD: Mild stenosis (25-49%) due to mixed plaque proximal/mid LAD just before the takeoff of the second diagonal branch. Mid to distal LAD is patent. First Diagonal: Mild stenosis (25-49%) due to calcified plaque at the ostial segment but mid to distal segment patent. Second Diagonal: Patent. LCx: Patent. RCA: Minimal non-calcified plaque within proximal RCA. Mid and distal RCA patent. 6. Study is sent for CT-FFR to further evaluate the LAD and first diagonal branch. Findings will be performed and reported separately. Non-cardiac findings: 1. Aortic Atherosclerosis (ICD10-I70.0). 2. 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 Recommend annual imaging followup by CTA or MRA. This recommendation follows 2010  ACCF/AHA/AATS/ACR/ASA/SCA/SCAI/SIR/STS/SVM Guidelines for the Diagnosis and Management of Patients with Thoracic Aortic Disease. Circulation. 2010; [id number]. Aortic aneurysm NOS (ICD10-I71.9). 3. Elevation of the left hemidiaphragm.  CTFFR 01/22/2022: CT FFR analysis showed no significant stenosis.   LABORATORY DATA:     No data to display             Latest Ref Rng & Units 01/09/2022    1:24 PM 12/06/2021    9:53 AM 11/13/2021    9:56 AM  CMP  Glucose 70 - 99 mg/dL 127  108  97   BUN 8 - 27 mg/dL '16  15  13   '$ Creatinine 0.76 - 1.27 mg/dL 1.25  1.15  1.13   Sodium 134 - 144 mmol/L 141  138  139   Potassium 3.5 - 5.2 mmol/L 4.1  4.5  5.0   Chloride 96 - 106 mmol/L 98  96  100   CO2 20 - 29 mmol/L '28  27  25   '$ Calcium 8.6 - 10.2 mg/dL 9.3  10.0  9.5     Lipid Panel  No results found for: "CHOL", "TRIG", "HDL", "CHOLHDL", "VLDL", "LDLCALC", "LDLDIRECT", "LABVLDL"  No components found for: "NTPROBNP" No results for input(s): "PROBNP" in the last 8760 hours. No results for input(s): "TSH" in the last 8760 hours.  BMP No results for input(s): "NA", "K", "CL", "CO2", "GLUCOSE", "BUN", "CREATININE", "CALCIUM", "GFRNONAA", "GFRAA" in the last 8760 hours.   HEMOGLOBIN A1C No results found for: "HGBA1C", "MPG"  External Labs: Collected: 09/06/2021 provided by PCP. Sodium 136, potassium 4.5, chloride 102, bicarb 23, BUN 20, creatinine 1.2. AST 16, ALT 11, alkaline phosphatase 69. Hemoglobin 14.7 g/dL, hematocrit 44.7% Total cholesterol 116, triglycerides 65, LDL 60, HDL 43, non-HDL 73. TSH 0.99  IMPRESSION:    ICD-10-CM   1. Nonobstructive atherosclerosis of coronary artery  I25.10 EKG 12-Lead    2. Coronary atherosclerosis due to calcified coronary lesion  I25.10    I25.84     3. Aortic ectasia (HCC)  I77.819     4. Dyspnea on exertion  R06.09     5. Benign hypertension  I10        RECOMMENDATIONS: Dathan Banaszewski is a 62 y.o. male whose past medical history  and cardiac risk factors include: Mild coronary artery calcification (total CAC 72.4 EU at 57th percentile), mild nonobstructive CAD, mild ascending aorta dilatation (38 mm 01/21/2022), benign essential hypertension, sleep apnea on CPAP, hyperlipidemia, family history of heart disease.   Nonobstructive atherosclerosis of coronary artery / Coronary atherosclerosis due to calcified coronary lesion Total CAC 72.4 AU, 57 percentile. Echo: LVEF 60 to 65%, mild to moderate LVH, mild MR, mild TR.  MPI: Small to medium reversible mild perfusion defect in the lateral, inferior and apical regions.  LVEF 57%, low risk study.  Coronary  CTA: Mild nonobstructive CAD. Continue aspirin and statin therapy. Educated him on the importance of improving his modifiable cardiovascular risk factors  Aortic ectasia (HCC) Had a coronary calcium score patient was noted to have an ascending aorta measuring 41 mm.  On recent coronary CTA measured double oblique fashion ascending aorta was noted to be 38 mm. Most recent echo from February 2024 notes the sinotubular junction to be 39 mm and the ascending aorta approximately was 40 mm.  Significant change in growth rate.  Will recommend an echo every 2 years or CT of the chest.  Patient is agreeable with the plan of care.  Due to limitations of EMR I cannot schedule studies more than a year out.  Benign hypertension Office blood pressures are well-controlled. Medications reconciled. No changes warranted at this time.  Obstructive sleep apnea syndrome Educated on importance of CPAP compliance working on better masking fitting.   Scheduled for as well visit annually next week.  He will have labs done at that time.  I requested him to forward a copy for reference.  Patient also retires on March 27, 2023.  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  FINAL MEDICATION LIST END OF ENCOUNTER: No orders of the defined types were placed in this encounter.     There are no discontinued medications.    Current Outpatient Medications:    ASPIRIN LOW DOSE 81 MG tablet, TAKE 1 TABLET(81 MG) BY MOUTH DAILY. SWALLOW WHOLE, Disp: 30 tablet, Rfl: 11   atorvastatin (LIPITOR) 20 MG tablet, Take 20 mg by mouth daily., Disp: , Rfl:    hydrochlorothiazide (HYDRODIURIL) 25 MG tablet, TAKE 1 TABLET(25 MG) BY MOUTH EVERY MORNING, Disp: 90 tablet, Rfl: 0   losartan (COZAAR) 25 MG tablet, Take 1 tablet (25 mg total) by mouth daily., Disp: 90 tablet, Rfl: 1   nitroGLYCERIN (NITROSTAT) 0.4 MG SL tablet, PLACE 1 TABLET UNDER TONGUE EVERY 5 MINUTES AS NEEDED FOR CHEST PAIN; MORE THAN 2 TABS 5 MINUTES APART GO TO ER VIA EMS, Disp: 25 tablet, Rfl: 0  Orders Placed This Encounter  Procedures   EKG 12-Lead    There are no Patient Instructions on file for this visit.   --Continue cardiac medications as reconciled in final medication list. --Return in about 1 year (around 02/25/2024) for Annual follow up visit, Coronary artery calcification. Or sooner if needed. --Continue follow-up with your primary care physician regarding the management of your other chronic comorbid conditions.  Patient's questions and concerns were addressed to his satisfaction. He voices understanding of the instructions provided during this encounter.   This note was created using a voice recognition software as a result there may be grammatical errors inadvertently enclosed that do not reflect the nature of this encounter. Every attempt is made to correct such errors.  Rex Kras, [place], [facility]  Pager: [phone] Office: [phone]

## 2023-03-06 DIAGNOSIS — Z0000 Encounter for general adult medical examination without abnormal findings: Secondary | ICD-10-CM | POA: Diagnosis not present

## 2023-03-06 DIAGNOSIS — R058 Other specified cough: Secondary | ICD-10-CM | POA: Diagnosis not present

## 2023-03-06 DIAGNOSIS — Z1331 Encounter for screening for depression: Secondary | ICD-10-CM | POA: Diagnosis not present

## 2023-03-06 DIAGNOSIS — Z1339 Encounter for screening examination for other mental health and behavioral disorders: Secondary | ICD-10-CM | POA: Diagnosis not present

## 2023-03-06 DIAGNOSIS — E663 Overweight: Secondary | ICD-10-CM | POA: Diagnosis not present

## 2023-03-06 DIAGNOSIS — I2510 Atherosclerotic heart disease of native coronary artery without angina pectoris: Secondary | ICD-10-CM | POA: Diagnosis not present

## 2023-03-06 DIAGNOSIS — E785 Hyperlipidemia, unspecified: Secondary | ICD-10-CM | POA: Diagnosis not present

## 2023-03-06 DIAGNOSIS — I10 Essential (primary) hypertension: Secondary | ICD-10-CM | POA: Diagnosis not present

## 2024-03-01 LAB — LAB REPORT - SCANNED: EGFR: 67.4

## 2024-03-11 ENCOUNTER — Encounter: Payer: Self-pay | Admitting: Cardiology

## 2024-03-21 ENCOUNTER — Encounter: Payer: Self-pay | Admitting: Cardiology

## 2024-03-21 ENCOUNTER — Ambulatory Visit: Payer: BC Managed Care – PPO | Attending: Cardiology | Admitting: Cardiology

## 2024-03-21 VITALS — BP 120/88 | HR 77 | Resp 16 | Ht 74.0 in | Wt 204.2 lb

## 2024-03-21 DIAGNOSIS — I77819 Aortic ectasia, unspecified site: Secondary | ICD-10-CM

## 2024-03-21 DIAGNOSIS — I2510 Atherosclerotic heart disease of native coronary artery without angina pectoris: Secondary | ICD-10-CM | POA: Diagnosis not present

## 2024-03-21 DIAGNOSIS — I2584 Coronary atherosclerosis due to calcified coronary lesion: Secondary | ICD-10-CM

## 2024-03-21 DIAGNOSIS — I10 Essential (primary) hypertension: Secondary | ICD-10-CM

## 2024-03-21 NOTE — Patient Instructions (Signed)
 Medication Instructions:  Your physician recommends that you continue on your current medications as directed. Please refer to the Current Medication list given to you today.  *If you need a refill on your cardiac medications before your next appointment, please call your pharmacy*  Lab Work: None ordered today. If you have labs (blood work) drawn today and your tests are completely normal, you will receive your results only by: MyChart Message (if you have MyChart) OR A paper copy in the mail If you have any lab test that is abnormal or we need to change your treatment, we will call you to review the results.  Testing/Procedures: Your physician has requested that you have a CT chest without contrast in February 2026.  Follow-Up: At [facility], you and your health needs are our priority.  As part of our continuing mission to provide you with exceptional heart care, we have created designated Provider Care Teams.  These Care Teams include your primary Cardiologist (physician) and Advanced Practice Providers (APPs -  Physician Assistants and Nurse Practitioners) who all work together to provide you with the care you need, when you need it.  We recommend signing up for the patient portal called "MyChart".  Sign up information is provided on this After Visit Summary.  MyChart is used to connect with patients for Virtual Visits (Telemedicine).  Patients are able to view lab/test results, encounter notes, upcoming appointments, etc.  Non-urgent messages can be sent to your provider as well.   To learn more about what you can do with MyChart, go to [email or web address].    Your next appointment:   March 2026  The format for your next appointment:   In Person  Provider:   Tessa Lerner, DO {

## 2024-03-21 NOTE — Progress Notes (Signed)
 Cardiology Office Note:  .   Date:  03/21/2024  ID:  Fernando Estrada, DOB 12-Apr-1961, MRN [id number] PCP:  Charlane Ferretti, DO  Former Cardiology Providers: NA [facility] Providers Cardiologist:  Tessa Lerner, DO , [facility] (established care 10/10/2021) Electrophysiologist:  None  Click to update primary MD,subspecialty MD or APP then REFRESH:1}    Chief Complaint  Patient presents with   Coronary artery calcification   Follow-up    History of Present Illness: .   Fernando Estrada is a 63 y.o. Caucasian male whose past medical history and cardiovascular risk factors includes: Mild coronary artery calcification (total CAC 72.4 AU at 57th percentile), mild nonobstructive CAD, mild ascending aorta dilatation (40 mm 01/2023), benign essential hypertension, sleep apnea on CPAP, hyperlipidemia, family history of heart disease.   In the past patient was referred to the practice for evaluation of heart disease and dyspnea.  He underwent appropriate ischemic workup and presents today for 1 year follow-up visit.  Since last office visit patient denies any anginal chest pain or heart failure symptoms.  No hospitalizations or urgent care visits for cardiovascular reasons.  He has been compliant with his medical therapy.  No significant weight gain.  Physical endurance remains stable.  Home blood pressures are stable, in fact at times he is experiencing lightheaded and dizziness and therefore when he followed up with his annual well visit his PCP reduced hydrochlorothiazide from 25 mg p.o. every morning to 12.5 mg p.o. every morning and this has helped his symptoms.  Since last office visit he is now officially retired and enjoying his retirement.  He is congratulated for reaching this milestone.    Review of Systems: .   Review of Systems  Cardiovascular:  Negative for chest pain, claudication, irregular heartbeat, leg swelling, near-syncope, orthopnea, palpitations, paroxysmal nocturnal dyspnea  and syncope.  Respiratory:  Negative for shortness of breath.   Hematologic/Lymphatic: Negative for bleeding problem.    Studies Reviewed:   EKG: EKG Interpretation Date/Time:  Monday March 21 2024 10:10:53 EDT Ventricular Rate:  74 PR Interval:  182 QRS Duration:  86 QT Interval:  370 QTC Calculation: 410 R Axis:   50  Text Interpretation: Normal sinus rhythm Normal ECG No previous ECGs available Confirmed by Tessa Lerner [phone]) on 03/21/2024 10:27:42 AM  Echocardiogram: 02/16/2023:  Normal LV systolic function with visual EF 55-60%. Left ventricle cavity is normal in size. Normal left ventricular wall thickness. Normal global wall motion. Normal diastolic filling pattern. Calculated EF 65%. Structurally normal tricuspid valve. Mild tricuspid regurgitation. No evidence of pulmonary hypertension. The aortic root is mildly dilated at 3.9 cm STJ. Mildly dilated ascending aorta at 4.0 cm. No significant change compared to 01/2021.    Stress Testing: Exercise nuclear stress test 11/12/2021:  Normal ECG stress. The patient exercised for 6 minutes and 0 seconds of a Bruce protocol, achieving approximately 7.05 METs. Patient was hypertensive but at rest and with stress with blood pressure 148/108 mmHg with peak 160/102 mmHg.  Myocardial perfusion is abnormal. There is a small to medium reversible mild defect in the lateral, inferior and apical regions.  Overall LV systolic function is normal without regional wall motion abnormalities. Stress LV EF: 57%.  No previous exam available for comparison. Low risk study.    Coronary calcium score 11/01/2021: 1. Coronary calcium score of 111 is at the 65th percentile for the patient's age, sex and race. 2. Mild aneurysmal dilatation of the ascending thoracic aorta measuring up to 4.1 cm. Recommend annual  imaging followup by CTA or MRA. This recommendation follows 2010 ACCF/AHA/AATS/ACR/ASA/SCA/SCAI/SIR/STS/SVM Guidelines for the Diagnosis and  Management of Patients with Thoracic Aortic Disease. Circulation. 2010; 121: G956-O130. Aortic aneurysm NOS (ICD10-I71.9) 3. Bilateral gynecomastia.   CCTA 01/21/2022 1. Total coronary calcium score of 72.4. This was 57th percentile for age and sex matched control. 2. Normal coronary origin with right dominance. 3. Mid ascending thoracic aorta mildly dilated (level of the right pulmonary artery) measures 38mm. 4. Aortic atherosclerosis. 5. CAD-RADS = 2 Mild non-obstructive CAD. Left main: Patent. LAD: Mild stenosis (25-49%) due to mixed plaque proximal/mid LAD just before the takeoff of the second diagonal branch. Mid to distal LAD is patent. First Diagonal: Mild stenosis (25-49%) due to calcified plaque at the ostial segment but mid to distal segment patent. Second Diagonal: Patent. LCx: Patent. RCA: Minimal non-calcified plaque within proximal RCA. Mid and distal RCA patent. 6. Study is sent for CT-FFR to further evaluate the LAD and first diagonal branch. Findings will be performed and reported separately. Non-cardiac findings: 1. Aortic Atherosclerosis (ICD10-I70.0). 2. 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 Recommend annual imaging followup by CTA or MRA. This recommendation follows 2010 ACCF/AHA/AATS/ACR/ASA/SCA/SCAI/SIR/STS/SVM Guidelines for the Diagnosis and Management of Patients with Thoracic Aortic Disease. Circulation. 2010; 121: Q657-Q469. Aortic aneurysm NOS (ICD10-I71.9). 3. Elevation of the left hemidiaphragm.  CTFFR 01/22/2022: CT FFR analysis showed no significant stenosis.   RADIOLOGY: NA  Risk Assessment/Calculations:   NA   Labs:    External Labs: Collected: 09/06/2021 provided by PCP. Sodium 136, potassium 4.5, chloride 102, bicarb 23, BUN 20, creatinine 1.2. AST 16, ALT 11, alkaline phosphatase 69. Hemoglobin 14.7 g/dL, hematocrit 62.9% Total cholesterol 116, triglycerides 65, LDL 60, HDL 43, non-HDL 73. TSH 0.99     Latest Ref Rng &  Units 01/09/2022    1:24 PM 12/06/2021    9:53 AM 11/13/2021    9:56 AM  BMP  Glucose 70 - 99 mg/dL 528  413  97   BUN 8 - 27 mg/dL 16  15  13    Creatinine 0.76 - 1.27 mg/dL 2.44  0.10  2.72   BUN/Creat Ratio 10 - 24 13  13  12    Sodium 134 - 144 mmol/L 141  138  139   Potassium 3.5 - 5.2 mmol/L 4.1  4.5  5.0   Chloride 96 - 106 mmol/L 98  96  100   CO2 20 - 29 mmol/L 28  27  25    Calcium 8.6 - 10.2 mg/dL 9.3  53.6  9.5       Latest Ref Rng & Units 01/09/2022    1:24 PM 12/06/2021    9:53 AM 11/13/2021    9:56 AM  CMP  Glucose 70 - 99 mg/dL 644  034  97   BUN 8 - 27 mg/dL 16  15  13    Creatinine 0.76 - 1.27 mg/dL 7.42  5.95  6.38   Sodium 134 - 144 mmol/L 141  138  139   Potassium 3.5 - 5.2 mmol/L 4.1  4.5  5.0   Chloride 96 - 106 mmol/L 98  96  100   CO2 20 - 29 mmol/L 28  27  25    Calcium 8.6 - 10.2 mg/dL 9.3  75.6  9.5     No results found for: "CHOL", "HDL", "LDLCALC", "LDLDIRECT", "TRIG", "CHOLHDL" No results for input(s): "LIPOA" in the last 8760 hours. No components found for: "NTPROBNP" No results for input(s): "PROBNP" in the last 8760  hours. No results for input(s): "TSH" in the last 8760 hours.   External Labs: Collected: 3//2025 provided by PCP. Creatinine 1.1, BUN 19, Sodium 139, potassium 4.6, chloride 101, bicarb 32 Hemoglobin 13.2 Total cholesterol 123, triglycerides 70, HDL 49, LDL 60, non-HDL 74. TSH 1.52 Physical Exam:    Today's Vitals   03/21/24 1006  BP: 120/88  Pulse: 77  Resp: 16  SpO2: 95%  Weight: 204 lb 3.2 oz (92.6 kg)  Height: 6\' 2"  (1.88 m)   Body mass index is 26.22 kg/m. Wt Readings from Last 3 Encounters:  03/21/24 204 lb 3.2 oz (92.6 kg)  02/24/23 214 lb (97.1 kg)  02/14/22 215 lb (97.5 kg)    Physical Exam  Constitutional: No distress.  hemodynamically stable  Neck: No JVD present.  Cardiovascular: Normal rate, regular rhythm, S1 normal and S2 normal. Exam reveals no gallop, no S3 and no S4.  No murmur  heard. Pulmonary/Chest: Effort normal and breath sounds normal. No stridor. He has no wheezes. He has no rales.  Musculoskeletal:        General: No edema.     Cervical back: Neck supple.  Skin: Skin is warm.   Impression & Recommendation(s):  Impression:   ICD-10-CM   1. Aortic ectasia (HCC)  I77.819 CT Chest Wo Contrast    2. Coronary atherosclerosis due to calcified coronary lesion  I25.10 EKG 12-Lead   I25.84     3. Nonobstructive atherosclerosis of coronary artery  I25.10     4. Benign hypertension  I10        Recommendation(s):  Aortic ectasia (HCC) Patient has had multiple studies in the past evaluating the dimensions of his ascending aorta as mentioned above. Since the measurements have remained stable patient participated in shared decision making to monitor it every 2 years. He had an echo back in February 2024 which noted ascending aorta to measure 40 mm. Will schedule a CT of the chest without contrast in February 2026 Remains on ARB. Office and home blood pressures are well-controlled. Patient is aware of symptoms suggestive of aortic dissection/syndromes and if present should go to the closest ER via EMS.  Coronary atherosclerosis due to calcified coronary lesion Nonobstructive atherosclerosis of coronary artery Remains on aspirin 81 mg p.o. daily as well as Crestor 20 mg p.o. nightly. No reoccurrence of anginal chest pain. Reviewed his ischemic workup that was performed in the recent past as mentioned above as part of medical decision making today. Outside labs from March 2025 independently reviewed and noted above, LDL at goal.  Benign hypertension Office and home blood pressures are well-controlled. He is no longer experiencing lightheaded and dizziness after reduction of hydrochlorothiazide from 25 mg p.o. daily to 12.5 mg p.o. daily Reemphasized importance of low-salt diet   Orders Placed:  Orders Placed This Encounter  Procedures   CT Chest Wo  Contrast    Standing Status:   Future    Expiration Date:   03/21/2025    Preferred imaging location?:   [facility]   Lab report - scanned   EKG 12-Lead     Final Medication List:   No orders of the defined types were placed in this encounter.   Medications Discontinued During This Encounter  Medication Reason   nitroGLYCERIN (NITROSTAT) 0.4 MG SL tablet Patient Preference     Current Outpatient Medications:    ASPIRIN LOW DOSE 81 MG tablet, TAKE 1 TABLET(81 MG) BY MOUTH DAILY. SWALLOW WHOLE, Disp: 30 tablet, Rfl: 11  atorvastatin (LIPITOR) 20 MG tablet, Take 20 mg by mouth daily., Disp: , Rfl:    hydrochlorothiazide (HYDRODIURIL) 25 MG tablet, TAKE 1 TABLET(25 MG) BY MOUTH EVERY MORNING (Patient taking differently: Take 12.5 mg by mouth daily. TAKE 1 TABLET(25 MG) BY MOUTH EVERY MORNING), Disp: 90 tablet, Rfl: 0   losartan (COZAAR) 25 MG tablet, Take 1 tablet (25 mg total) by mouth daily., Disp: 90 tablet, Rfl: 1  Consent:   NA  Disposition:   1 year follow-up sooner if needed  His questions and concerns were addressed to his satisfaction. He voices understanding of the recommendations provided during this encounter.    Signed, Tessa Lerner, DO, [facility] [place]  [facility]  [address] [place], [place] [address] 03/21/2024 10:54 AM

## 2025-02-13 ENCOUNTER — Ambulatory Visit (HOSPITAL_COMMUNITY)
